# Patient Record
Sex: Male | Born: 1980
Health system: Southern US, Community
[De-identification: ages and names within clinical notes are randomized; demographics above are authoritative.]

## PROBLEM LIST (undated history)

## (undated) DIAGNOSIS — D179 Benign lipomatous neoplasm, unspecified: Secondary | ICD-10-CM

## (undated) DIAGNOSIS — R51 Headache: Secondary | ICD-10-CM

## (undated) DIAGNOSIS — E119 Type 2 diabetes mellitus without complications: Secondary | ICD-10-CM

## (undated) DIAGNOSIS — R519 Headache, unspecified: Secondary | ICD-10-CM

## (undated) HISTORY — DX: Headache, unspecified: R51.9

## (undated) HISTORY — DX: Type 2 diabetes mellitus without complications: E11.9

## (undated) HISTORY — DX: Headache: R51

## (undated) HISTORY — PX: OTHER SURGICAL HISTORY: SHX169

## (undated) HISTORY — DX: Benign lipomatous neoplasm, unspecified: D17.9

---

## 2017-08-31 DIAGNOSIS — J029 Acute pharyngitis, unspecified: Secondary | ICD-10-CM | POA: Diagnosis not present

## 2017-09-25 DIAGNOSIS — Z23 Encounter for immunization: Secondary | ICD-10-CM | POA: Diagnosis not present

## 2018-08-09 ENCOUNTER — Ambulatory Visit: Payer: Self-pay | Admitting: Internal Medicine

## 2018-08-12 ENCOUNTER — Ambulatory Visit: Payer: Self-pay | Admitting: Internal Medicine

## 2018-09-23 ENCOUNTER — Encounter

## 2018-09-23 ENCOUNTER — Encounter: Payer: Self-pay | Admitting: Internal Medicine

## 2018-09-23 ENCOUNTER — Ambulatory Visit: Payer: Commercial Managed Care - PPO | Admitting: Internal Medicine

## 2018-09-23 VITALS — BP 128/80 | HR 81 | Temp 98.5°F | Ht 65.0 in | Wt 185.4 lb

## 2018-09-23 DIAGNOSIS — K760 Fatty (change of) liver, not elsewhere classified: Secondary | ICD-10-CM

## 2018-09-23 DIAGNOSIS — B356 Tinea cruris: Secondary | ICD-10-CM

## 2018-09-23 DIAGNOSIS — L989 Disorder of the skin and subcutaneous tissue, unspecified: Secondary | ICD-10-CM

## 2018-09-23 DIAGNOSIS — D179 Benign lipomatous neoplasm, unspecified: Secondary | ICD-10-CM

## 2018-09-23 DIAGNOSIS — R748 Abnormal levels of other serum enzymes: Secondary | ICD-10-CM

## 2018-09-23 DIAGNOSIS — L309 Dermatitis, unspecified: Secondary | ICD-10-CM | POA: Diagnosis not present

## 2018-09-23 DIAGNOSIS — Z23 Encounter for immunization: Secondary | ICD-10-CM | POA: Diagnosis not present

## 2018-09-23 DIAGNOSIS — E119 Type 2 diabetes mellitus without complications: Secondary | ICD-10-CM

## 2018-09-23 DIAGNOSIS — Z1329 Encounter for screening for other suspected endocrine disorder: Secondary | ICD-10-CM

## 2018-09-23 DIAGNOSIS — Z113 Encounter for screening for infections with a predominantly sexual mode of transmission: Secondary | ICD-10-CM

## 2018-09-23 DIAGNOSIS — M79622 Pain in left upper arm: Secondary | ICD-10-CM

## 2018-09-23 DIAGNOSIS — E559 Vitamin D deficiency, unspecified: Secondary | ICD-10-CM

## 2018-09-23 MED ORDER — MICONAZOLE NITRATE 2 % EX CREA: 1.0000 "application " | TOPICAL_CREAM | Freq: Two times a day (BID) | CUTANEOUS | 0 refills | Status: DC

## 2018-09-23 MED ORDER — FLUCONAZOLE 150 MG PO TABS: 150.0000 mg | ORAL_TABLET | Freq: Once | ORAL | 0 refills | Status: DC

## 2018-09-23 MED ORDER — CLOBETASOL PROPIONATE 0.05 % EX CREA: 1.0000 "application " | TOPICAL_CREAM | Freq: Two times a day (BID) | CUTANEOUS | 0 refills | Status: DC

## 2018-09-23 NOTE — Progress Notes (Signed)
Pre visit review using our clinic review tool, if applicable. No additional management support is needed unless otherwise documented below in the visit note. 

## 2018-09-23 NOTE — Progress Notes (Signed)
Chief Complaint  Patient presents with  . Establish Care   New pt  1. C/o rash to groin area since summer 2019 after sweating  2. DM 2 A1C was 6.9 12/03/17 at [facility]  3. C/o deeper lesions trunk, b/l arms and back, left upper arm lesion causes pain with heavy lifting 4. C/o cyst to right thigh then another lesion developed right thigh red and raised nothing tried   Review of Systems  Constitutional: Negative for weight loss.  HENT: Negative for hearing loss.   Eyes: Negative for blurred vision.  Respiratory: Negative for shortness of breath.   Cardiovascular: Negative for chest pain.  Gastrointestinal: Negative for abdominal pain.  Musculoskeletal: Negative for falls.  Skin: Positive for rash.  Neurological: Negative for headaches.  Psychiatric/Behavioral: Negative for depression.   Past Medical History:  Diagnosis Date  . Diabetes mellitus without complication ([place])   . Frequent headaches    Past Surgical History:  Procedure Laterality Date  . right cyst removal     right forehead    Family History  Problem Relation Age of Onset  . Asthma Father   . Diabetes Father        2  . Hypertension Father    Social History   Socioeconomic History  . Marital status: Single    Spouse name: Not on file  . Number of children: Not on file  . Years of education: Not on file  . Highest education level: Not on file  Occupational History  . Not on file  Social Needs  . Financial resource strain: Not on file  . Food insecurity:    Worry: Not on file    Inability: Not on file  . Transportation needs:    Medical: Not on file    Non-medical: Not on file  Tobacco Use  . Smoking status: Never Smoker  . Smokeless tobacco: Never Used  Substance and Sexual Activity  . Alcohol use: Yes  . Drug use: Not Currently  . Sexual activity: Yes    Partners: Female  Lifestyle  . Physical activity:    Days per week: Not on file    Minutes per session: Not on file  . Stress: Not on file    Relationships  . Social connections:    Talks on phone: Not on file    Gets together: Not on file    Attends religious service: Not on file    Active member of club or organization: Not on file    Attends meetings of clubs or organizations: Not on file    Relationship status: Not on file  . Intimate partner violence:    Fear of current or ex partner: Not on file    Emotionally abused: Not on file    Physically abused: Not on file    Forced sexual activity: Not on file  Other Topics Concern  . Not on file  Social History Narrative   HS ed    [employer]    Single 2 kids    No outpatient medications have been marked as taking for the 09/23/18 encounter (Office Visit) with McLean-Scocuzza, Nino Glow, MD.   No Known Allergies No results found for this or any previous visit (from the past 2160 hour(s)). Objective  Body mass index is 30.85 kg/m. Wt Readings from Last 3 Encounters:  09/23/18 185 lb 6.4 oz (84.1 kg)   Temp Readings from Last 3 Encounters:  09/23/18 98.5 F (36.9 C) (Oral)   BP Readings from Last  3 Encounters:  09/23/18 128/80   Pulse Readings from Last 3 Encounters:  09/23/18 81    Physical Exam  Constitutional: He is oriented to person, place, and time. Vital signs are normal. He appears well-developed and well-nourished. He is cooperative.  HENT:  Head: Normocephalic and atraumatic.  Mouth/Throat: Oropharynx is clear and moist and mucous membranes are normal.  Eyes: Pupils are equal, round, and reactive to light. Conjunctivae are normal.  Cardiovascular: Normal rate, regular rhythm and normal heart sounds.  Pulmonary/Chest: Effort normal and breath sounds normal.  Neurological: He is alert and oriented to person, place, and time. Gait normal.  Skin: Skin is warm, dry and intact.     Psychiatric: He has a normal mood and affect. His speech is normal and behavior is normal. Judgment and thought content normal. Cognition and memory are normal.  Nursing note and  vitals reviewed.   Assessment   1. T cruris  2. DM 2 A1C 6.9 12/03/17  3. ? Lipoma vs NF left upper arm causing pain. Also on trunk and abdomen 4. Right thigh dermatosis  5. HM 6. Elevated lfts  Plan   1. M[place], diflucan x 1 zeasorb af  2. Check fasting labs asap f/u in 2 weeks  Consider nutrition eye and foot exam  3. MRI left humerus  4. Clobetasol  5.  Flu and Tdap today  Consider STD check in future  6. Repeat lfts, US abdomen    Provider: Dr. Olivia Mackie McLean-Scocuzza-Internal Medicine

## 2018-09-23 NOTE — Patient Instructions (Addendum)
Zeasorb AF powder to keep you dry in the summer  Mild steroid cream hydrocortisone over the counter 1% 2x per day to private area  -with antifungal cream Miconazole    Diabetes Mellitus and Exercise Exercising regularly is important for your overall health, especially when you have diabetes (diabetes mellitus). Exercising is not only about losing weight. It has many other health benefits, such as increasing muscle strength and bone density and reducing body fat and stress. This leads to improved fitness, flexibility, and endurance, all of which result in better overall health. Exercise has additional benefits for people with diabetes, including:  Reducing appetite.  Helping to lower and control blood glucose.  Lowering blood pressure.  Helping to control amounts of fatty substances (lipids) in the blood, such as cholesterol and triglycerides.  Helping the body to respond better to insulin (improving insulin sensitivity).  Reducing how much insulin the body needs.  Decreasing the risk for heart disease by: ? Lowering cholesterol and triglyceride levels. ? Increasing the levels of good cholesterol. ? Lowering blood glucose levels. What is my activity plan? Your health care provider or certified diabetes educator can help you make a plan for the type and frequency of exercise (activity plan) that works for you. Make sure that you:  Do at least 150 minutes of moderate-intensity or vigorous-intensity exercise each week. This could be brisk walking, biking, or water aerobics. ? Do stretching and strength exercises, such as yoga or weightlifting, at least 2 times a week. ? Spread out your activity over at least 3 days of the week.  Get some form of physical activity every day. ? Do not go more than 2 days in a row without some kind of physical activity. ? Avoid being inactive for more than 30 minutes at a time. Take frequent breaks to walk or stretch.  Choose a type of exercise or  activity that you enjoy, and set realistic goals.  Start slowly, and gradually increase the intensity of your exercise over time. What do I need to know about managing my diabetes?   Check your blood glucose before and after exercising. ? If your blood glucose is 240 mg/dL (13.3 mmol/L) or higher before you exercise, check your urine for ketones. If you have ketones in your urine, do not exercise until your blood glucose returns to normal. ? If your blood glucose is 100 mg/dL (5.6 mmol/L) or lower, eat a snack containing 15-20 grams of carbohydrate. Check your blood glucose 15 minutes after the snack to make sure that your level is above 100 mg/dL (5.6 mmol/L) before you start your exercise.  Know the symptoms of low blood glucose (hypoglycemia) and how to treat it. Your risk for hypoglycemia increases during and after exercise. Common symptoms of hypoglycemia can include: ? Hunger. ? Anxiety. ? Sweating and feeling clammy. ? Confusion. ? Dizziness or feeling light-headed. ? Increased heart rate or palpitations. ? Blurry vision. ? Tingling or numbness around the mouth, lips, or tongue. ? Tremors or shakes. ? Irritability.  Keep a rapid-acting carbohydrate snack available before, during, and after exercise to help prevent or treat hypoglycemia.  Avoid injecting insulin into areas of the body that are going to be exercised. For example, avoid injecting insulin into: ? The arms, when playing tennis. ? The legs, when jogging.  Keep records of your exercise habits. Doing this can help you and your health care provider adjust your diabetes management plan as needed. Write down: ? Food that you eat before and  after you exercise. ? Blood glucose levels before and after you exercise. ? The type and amount of exercise you have done. ? When your insulin is expected to peak, if you use insulin. Avoid exercising at times when your insulin is peaking.  When you start a new exercise or activity,  work with your health care provider to make sure the activity is safe for you, and to adjust your insulin, medicines, or food intake as needed.  Drink plenty of water while you exercise to prevent dehydration or heat stroke. Drink enough fluid to keep your urine clear or pale yellow. Summary  Exercising regularly is important for your overall health, especially when you have diabetes (diabetes mellitus).  Exercising has many health benefits, such as increasing muscle strength and bone density and reducing body fat and stress.  Your health care provider or certified diabetes educator can help you make a plan for the type and frequency of exercise (activity plan) that works for you.  When you start a new exercise or activity, work with your health care provider to make sure the activity is safe for you, and to adjust your insulin, medicines, or food intake as needed. This information is not intended to replace advice given to you by your health care provider. Make sure you discuss any questions you have with your health care provider. Document Released: 12/26/2003 Document Revised: 04/15/2017 Document Reviewed: 03/16/2016 Elsevier Interactive Patient Education  2019 [place].  Diabetes Mellitus and Nutrition, Adult When you have diabetes (diabetes mellitus), it is very important to have healthy eating habits because your blood sugar (glucose) levels are greatly affected by what you eat and drink. Eating healthy foods in the appropriate amounts, at about the same times every day, can help you:  Control your blood glucose.  Lower your risk of heart disease.  Improve your blood pressure.  Reach or maintain a healthy weight. Every person with diabetes is different, and each person has different needs for a meal plan. Your health care provider may recommend that you work with a diet and nutrition specialist (dietitian) to make a meal plan that is best for you. Your meal plan may vary  depending on factors such as:  The calories you need.  The medicines you take.  Your weight.  Your blood glucose, blood pressure, and cholesterol levels.  Your activity level.  Other health conditions you have, such as heart or kidney disease. How do carbohydrates affect me? Carbohydrates, also called carbs, affect your blood glucose level more than any other type of food. Eating carbs naturally raises the amount of glucose in your blood. Carb counting is a method for keeping track of how many carbs you eat. Counting carbs is important to keep your blood glucose at a healthy level, especially if you use insulin or take certain oral diabetes medicines. It is important to know how many carbs you can safely have in each meal. This is different for every person. Your dietitian can help you calculate how many carbs you should have at each meal and for each snack. Foods that contain carbs include:  Bread, cereal, rice, pasta, and crackers.  Potatoes and corn.  Peas, beans, and lentils.  Milk and yogurt.  Fruit and juice.  Desserts, such as cakes, cookies, ice cream, and candy. How does alcohol affect me? Alcohol can cause a sudden decrease in blood glucose (hypoglycemia), especially if you use insulin or take certain oral diabetes medicines. Hypoglycemia can be a life-threatening condition. Symptoms  of hypoglycemia (sleepiness, dizziness, and confusion) are similar to symptoms of having too much alcohol. If your health care provider says that alcohol is safe for you, follow these guidelines:  Limit alcohol intake to no more than 1 drink per day for nonpregnant women and 2 drinks per day for men. One drink equals 12 oz of beer, 5 oz of wine, or 1 oz of hard liquor.  Do not drink on an empty stomach.  Keep yourself hydrated with water, diet soda, or unsweetened iced tea.  Keep in mind that regular soda, juice, and other mixers may contain a lot of sugar and must be counted as  carbs. What are tips for following this plan?  Reading food labels  Start by checking the serving size on the "Nutrition Facts" label of packaged foods and drinks. The amount of calories, carbs, fats, and other nutrients listed on the label is based on one serving of the item. Many items contain more than one serving per package.  Check the total grams (g) of carbs in one serving. You can calculate the number of servings of carbs in one serving by dividing the total carbs by 15. For example, if a food has 30 g of total carbs, it would be equal to 2 servings of carbs.  Check the number of grams (g) of saturated and trans fats in one serving. Choose foods that have low or no amount of these fats.  Check the number of milligrams (mg) of salt (sodium) in one serving. Most people should limit total sodium intake to less than 2,300 mg per day.  Always check the nutrition information of foods labeled as "low-fat" or "nonfat". These foods may be higher in added sugar or refined carbs and should be avoided.  Talk to your dietitian to identify your daily goals for nutrients listed on the label. Shopping  Avoid buying canned, premade, or processed foods. These foods tend to be high in fat, sodium, and added sugar.  Shop around the outside edge of the grocery store. This includes fresh fruits and vegetables, bulk grains, fresh meats, and fresh dairy. Cooking  Use low-heat cooking methods, such as baking, instead of high-heat cooking methods like deep frying.  Cook using healthy oils, such as olive, canola, or sunflower oil.  Avoid cooking with butter, cream, or high-fat meats. Meal planning  Eat meals and snacks regularly, preferably at the same times every day. Avoid going long periods of time without eating.  Eat foods high in fiber, such as fresh fruits, vegetables, beans, and whole grains. Talk to your dietitian about how many servings of carbs you can eat at each meal.  Eat 4-6 ounces (oz)  of lean protein each day, such as lean meat, chicken, fish, eggs, or tofu. One oz of lean protein is equal to: ? 1 oz of meat, chicken, or fish. ? 1 egg. ?  cup of tofu.  Eat some foods each day that contain healthy fats, such as avocado, nuts, seeds, and fish. Lifestyle  Check your blood glucose regularly.  Exercise regularly as told by your health care provider. This may include: ? 150 minutes of moderate-intensity or vigorous-intensity exercise each week. This could be brisk walking, biking, or water aerobics. ? Stretching and doing strength exercises, such as yoga or weightlifting, at least 2 times a week.  Take medicines as told by your health care provider.  Do not use any products that contain nicotine or tobacco, such as cigarettes and e-cigarettes. If you need  help quitting, ask your health care provider.  Work with a Social worker or diabetes educator to identify strategies to manage stress and any emotional and social challenges. Questions to ask a health care provider  Do I need to meet with a diabetes educator?  Do I need to meet with a dietitian?  What number can I call if I have questions?  When are the best times to check my blood glucose? Where to find more information:  American Diabetes Association: diabetes.org  Academy of Nutrition and Dietetics: www.eatright.[employer] of Diabetes and Digestive and Kidney Diseases (NIH): [employer] Summary  A healthy meal plan will help you control your blood glucose and maintain a healthy lifestyle.  Working with a diet and nutrition specialist (dietitian) can help you make a meal plan that is best for you.  Keep in mind that carbohydrates (carbs) and alcohol have immediate effects on your blood glucose levels. It is important to count carbs and to use alcohol carefully. This information is not intended to replace advice given to you by your health care provider. Make sure you discuss any questions you  have with your health care provider. Document Released: 07/02/2005 Document Revised: 05/05/2017 Document Reviewed: 11/09/2016 Elsevier Interactive Patient Education  2019 [employer] Inc[employer]   Nonalcoholic Fatty Liver Disease Diet Nonalcoholic fatty liver disease is a condition that causes fat to accumulate in and around the liver. The disease makes it harder for the liver to work the way that it should. Following a healthy diet can help to keep nonalcoholic fatty liver disease under control. It can also help to prevent or improve conditions that are associated with the disease, such as heart disease, diabetes, high blood pressure, and abnormal cholesterol levels. Along with regular exercise, this diet:  Promotes weight loss.  Helps to control blood sugar levels.  Helps to improve the way that the body uses insulin. What do I need to know about this diet?  Use the glycemic index (GI) to plan your meals. The index tells you how quickly a food will raise your blood sugar. Choose low-GI foods. These foods take a longer time to raise blood sugar.  Keep track of how many calories you take in. Eating the right amount of calories will help you to achieve a healthy weight.  You may want to follow a Mediterranean diet. This diet includes a lot of vegetables, lean meats or fish, whole grains, fruits, and healthy oils and fats. What foods can I eat? Grains Whole grains, such as whole-wheat or whole-grain breads, crackers, tortillas, cereals, and pasta. Stone-ground whole wheat. Pumpernickel bread. Unsweetened oatmeal. Bulgur. Barley. Quinoa. Brown or wild rice. Corn or whole-wheat flour tortillas. Vegetables Lettuce. Spinach. Peas. Beets. Cauliflower. Cabbage. Broccoli. Carrots. Tomatoes. Squash. Eggplant. Herbs. Peppers. Onions. Cucumbers. Brussels sprouts. Yams and sweet potatoes. Beans. Lentils. Fruits Bananas. Apples. Oranges. Grapes. Papaya. Mango. Pomegranate. Kiwi. Grapefruit. Cherries. Meats and  Other Protein Sources Seafood and shellfish. Lean meats. Poultry. Tofu. Dairy Low-fat or fat-free dairy products, such as yogurt, cottage cheese, and cheese. Beverages Water. Sugar-free drinks. Tea. Coffee. Low-fat or skim milk. Milk alternatives, such as soy or almond milk. Real fruit juice. Condiments Mustard. Relish. Low-fat, low-sugar ketchup and barbecue sauce. Low-fat or fat-free mayonnaise. Sweets and Desserts Sugar-free sweets. Fats and Oils Avocado. Canola or olive oil. Nuts and nut butters. Seeds. The items listed above may not be a complete list of recommended foods or beverages. Contact your dietitian for more options. What foods are not recommended? [employer]  oil and coconut oil. Processed foods. Fried foods. Sweetened drinks, such as sweet tea, milkshakes, snow cones, iced sweet drinks, and sodas. Alcohol. Sweets. Foods that contain a lot of salt or sodium. The items listed above may not be a complete list of foods and beverages to avoid. Contact your dietitian for more information. This information is not intended to replace advice given to you by your health care provider. Make sure you discuss any questions you have with your health care provider. Document Released: 02/19/2015 Document Revised: 03/12/2016 Document Reviewed: 10/30/2014 Elsevier Interactive Patient Education  2019 [employer] Inc.  Fatty Liver Disease  Fatty liver disease occurs when too much fat has built up in your liver cells. Fatty liver disease is also called hepatic steatosis or steatohepatitis. The liver removes harmful substances from your bloodstream and produces fluids that your body needs. It also helps your body use and store energy from the food you eat. In many cases, fatty liver disease does not cause symptoms or problems. It is often diagnosed when tests are being done for other reasons. However, over time, fatty liver can cause inflammation that may lead to more serious liver problems, such as scarring  of the liver (cirrhosis) and liver failure. Fatty liver is associated with insulin resistance, increased body fat, high blood pressure (hypertension), and high cholesterol. These are features of metabolic syndrome and increase your risk for stroke, diabetes, and heart disease. What are the causes? This condition may be caused by:  Drinking too much alcohol.  Poor nutrition.  Obesity.  Cushing's syndrome.  Diabetes.  High cholesterol.  Certain drugs.  Poisons.  Some viral infections.  Pregnancy. What increases the risk? You are more likely to develop this condition if you:  Abuse alcohol.  Are overweight.  Have diabetes.  Have hepatitis.  Have a high triglyceride level.  Are pregnant. What are the signs or symptoms? Fatty liver disease often does not cause symptoms. If symptoms do develop, they can include:  Fatigue.  Weakness.  Weight loss.  Confusion.  Abdominal pain.  Nausea and vomiting.  Yellowing of your skin and the white parts of your eyes (jaundice).  Itchy skin. How is this diagnosed? This condition may be diagnosed by:  A physical exam and medical history.  Blood tests.  Imaging tests, such as an ultrasound, CT scan, or MRI.  A liver biopsy. A small sample of liver tissue is removed using a needle. The sample is then looked at under a microscope. How is this treated? Fatty liver disease is often caused by other health conditions. Treatment for fatty liver may involve medicines and lifestyle changes to manage conditions such as:  Alcoholism.  High cholesterol.  Diabetes.  Being overweight or obese. Follow these instructions at home:   Do not drink alcohol. If you have trouble quitting, ask your health care provider how to safely quit with the help of medicine or a supervised program. This is important to keep your condition from getting worse.  Eat a healthy diet as told by your health care provider. Ask your health care  provider about working with a diet and nutrition specialist (dietitian) to develop an eating plan.  Exercise regularly. This can help you lose weight and control your cholesterol and diabetes. Talk to your health care provider about an exercise plan and which activities are best for you.  Take over-the-counter and prescription medicines only as told by your health care provider.  Keep all follow-up visits as told by your health care  provider. This is important. Contact a health care provider if: You have trouble controlling your:  Blood sugar. This is especially important if you have diabetes.  Cholesterol.  Drinking of alcohol. Get help right away if:  You have abdominal pain.  You have jaundice.  You have nausea and vomiting.  You vomit blood or material that looks like coffee grounds.  You have stools that are black, tar-like, or bloody. Summary  Fatty liver disease develops when too much fat builds up in the cells of your liver.  Fatty liver disease often causes no symptoms or problems. However, over time, fatty liver can cause inflammation that may lead to more serious liver problems, such as scarring of the liver (cirrhosis).  You are more likely to develop this condition if you abuse alcohol, are pregnant, are overweight, have diabetes, have hepatitis, or have high triglyceride levels.  Contact your health care provider if you have trouble controlling your weight, blood sugar, cholesterol, or drinking of alcohol. This information is not intended to replace advice given to you by your health care provider. Make sure you discuss any questions you have with your health care provider. Document Released: 11/20/2005 Document Revised: 07/14/2017 Document Reviewed: 07/14/2017 Elsevier Interactive Patient Education  2019 [employer]   Lipoma A lipoma is a noncancerous (benign) tumor that is made up of fat cells. This is a very common type of soft-tissue growth. Lipomas are  usually found under the skin (subcutaneous). They may occur in any tissue of the body that contains fat. Common areas for lipomas to appear include the back, shoulders, buttocks, and thighs. Lipomas grow slowly, and they are usually painless. Most lipomas do not cause problems and do not require treatment. What are the causes? The cause of this condition is not known. What increases the risk? This condition is more likely to develop in:  People who are 90-49 years old.  People who have a family history of lipomas.  What are the signs or symptoms? A lipoma usually appears as a small, round bump under the skin. It may feel soft or rubbery, but the firmness can vary. Most lipomas are not painful. However, a lipoma may become painful if it is located in an area where it pushes on nerves. How is this diagnosed? A lipoma can usually be diagnosed with a physical exam. You may also have tests to confirm the diagnosis and to rule out other conditions. Tests may include:  Imaging tests, such as a CT scan or MRI.  Removal of a tissue sample to be looked at under a microscope (biopsy).  How is this treated? Treatment is not needed for small lipomas that are not causing problems. If a lipoma continues to get bigger or it causes problems, removal is often the best option. Lipomas can also be removed to improve appearance. Removal of a lipoma is usually done with a surgery in which the fatty cells and the surrounding capsule are removed. Most often, a medicine that numbs the area (local anesthetic) is used for this procedure. Follow these instructions at home:  Keep all follow-up visits as directed by your health care provider. This is important. Contact a health care provider if:  Your lipoma becomes larger or hard.  Your lipoma becomes painful, red, or increasingly swollen. These could be signs of infection or a more serious condition. This information is not intended to replace advice given to you  by your health care provider. Make sure you discuss any questions you have  with your health care provider. Document Released: 09/25/2002 Document Revised: 03/12/2016 Document Reviewed: 10/01/2014 Elsevier Interactive Patient Education  2018 [place] Jock itch (tinea cruris) is a fungal infection of the skin in the groin area. It is sometimes called ringworm, even though it is not caused by worms. It is caused by a fungus, which is a type of germ that thrives in dark, damp places. Jock itch causes a rash and itching in the groin and upper thigh area. It usually goes away in 2-3 weeks with treatment. What are the causes? The fungus that causes jock itch may be spread by:  Touching a fungus infection elsewhere on your body-such as athlete's foot-and then touching your groin area.  Sharing towels or clothing with an infected person.  What increases the risk? Jock itch is most common in men and adolescent boys. This condition is more likely to develop from:  Being in hot, humid climates.  Wearing tight-fitting clothing or wet bathing suits for long periods of time.  Participating in sports.  Being overweight.  Having diabetes.  What are the signs or symptoms? Symptoms of jock itch may include:  A red, pink, or brown rash in the groin area. The rash may spread to the thighs, anus, and buttocks.  Dry and scaly skin on or around the rash.  Itchiness.  How is this diagnosed? Most often, a health care provider can make the diagnosis by looking at your rash. Sometimes, a scraping of the infected skin will be taken. This sample may be tested by looking at it under a microscope or by trying to grow the fungus from the sample (culture). How is this treated? Treatment for this condition may include:  Antifungal medicine to kill the fungus. This may be in various forms: ? Skin cream or ointment. ? Medicine taken by mouth.  Skin cream or ointment to reduce the  itching.  Compresses or medicated powders to dry the infected skin.  Follow these instructions at home:  Take medicines only as directed by your health care provider. Apply skin creams or ointments exactly as directed.  Wear loose-fitting clothing. ? Men should wear cotton boxer shorts. ? Women should wear cotton underwear.  Change your underwear every day to keep your groin dry.  Avoid hot baths.  Dry your groin area well after bathing. ? Use a separate towel to dry your groin area. This will help to prevent a spreading of the infection to other areas of your body.  Do not scratch the affected area.  Do not share towels with other people. Contact a health care provider if:  Your rash does not improve or it gets worse after 2 weeks of treatment.  Your rash is spreading.  Your rash returns after treatment is finished.  You have a fever.  You have redness, swelling, or pain in the area around your rash.  You have fluid, blood, or pus coming from your rash.  Your have your rash for more than 4 weeks. This information is not intended to replace advice given to you by your health care provider. Make sure you discuss any questions you have with your health care provider. Document Released: 09/25/2002 Document Revised: 03/12/2016 Document Reviewed: 07/17/2014 Elsevier Interactive Patient Education  2018 [place] Vaccine (Diphtheria, Tetanus, and Pertussis): What You Need to Know 1. Why get vaccinated? Diphtheria, tetanus, and pertussis are serious diseases caused by bacteria. Diphtheria and pertussis are spread from person to person.  Tetanus enters the body through cuts or wounds. DIPHTHERIA causes a thick covering in the back of the throat.  It can lead to breathing problems, paralysis, heart failure, and even death.  TETANUS (Lockjaw) causes painful tightening of the muscles, usually all over the body.  It can lead to "locking" of the jaw so the  victim cannot open his mouth or swallow. Tetanus leads to death in up to 2 out of 10 cases.  PERTUSSIS (Whooping Cough) causes coughing spells so bad that it is hard for infants to eat, drink, or breathe. These spells can last for weeks.  It can lead to pneumonia, seizures (jerking and staring spells), brain damage, and death.  Diphtheria, tetanus, and pertussis vaccine (DTaP) can help prevent these diseases. Most children who are vaccinated with DTaP will be protected throughout childhood. Many more children would get these diseases if we stopped vaccinating. DTaP is a safer version of an older vaccine called DTP. DTP is no longer used in the [place]. 2. Who should get DTaP vaccine and when? Children should get 5 doses of DTaP vaccine, one dose at each of the following ages:  2 months  4 months  6 months  15-18 months  4-6 years  DTaP may be given at the same time as other vaccines. 3. Some children should not get DTaP vaccine or should wait  Children with minor illnesses, such as a cold, may be vaccinated. But children who are moderately or severely ill should usually wait until they recover before getting DTaP vaccine.  Any child who had a life-threatening allergic reaction after a dose of DTaP should not get another dose.  Any child who suffered a brain or nervous system disease within 7 days after a dose of DTaP should not get another dose.  Talk with your doctor if your child: ? had a seizure or collapsed after a dose of DTaP, ? cried non-stop for 3 hours or more after a dose of DTaP, ? had a fever over 105F after a dose of DTaP. Ask your doctor for more information. Some of these children should not get another dose of pertussis vaccine, but may get a vaccine without pertussis, called DT. 4. Older children and adults DTaP is not licensed for adolescents, adults, or children 19 years of age and older. But older people still need protection. A vaccine called Tdap is  similar to DTaP. A single dose of Tdap is recommended for people 11 through 35 years of age. Another vaccine, called Td, protects against tetanus and diphtheria, but not pertussis. It is recommended every 10 years. There are separate Vaccine Information Statements for these vaccines. 5. What are the risks from DTaP vaccine? Getting diphtheria, tetanus, or pertussis disease is much riskier than getting DTaP vaccine. However, a vaccine, like any medicine, is capable of causing serious problems, such as severe allergic reactions. The risk of DTaP vaccine causing serious harm, or death, is extremely small. Mild problems (common)  Fever (up to about 1 child in 4)  Redness or swelling where the shot was given (up to about 1 child in 4)  Soreness or tenderness where the shot was given (up to about 1 child in 4) These problems occur more often after the 4th and 5th doses of the DTaP series than after earlier doses. Sometimes the 4th or 5th dose of DTaP vaccine is followed by swelling of the entire arm or leg in which the shot was given, lasting 1-7 days (up to about  1 child in 56). Other mild problems include:  Fussiness (up to about 1 child in 3)  Tiredness or poor appetite (up to about 1 child in 10)  Vomiting (up to about 1 child in 51) These problems generally occur 1-3 days after the shot. Moderate problems (uncommon)  Seizure (jerking or staring) (about 1 child out of 14,000)  Non-stop crying, for 3 hours or more (up to about 1 child out of 1,000)  High fever, over 105F (about 1 child out of 16,000) Severe problems (very rare)  Serious allergic reaction (less than 1 out of a million doses)  Several other severe problems have been reported after DTaP vaccine. These include: ? Long-term seizures, coma, or lowered consciousness ? Permanent brain damage. These are so rare it is hard to tell if they are caused by the vaccine. Controlling fever is especially important for children who  have had seizures, for any reason. It is also important if another family member has had seizures. You can reduce fever and pain by giving your child an aspirin-free pain reliever when the shot is given, and for the next 24 hours, following the package instructions. 6. What if there is a serious reaction? What should I look for? Look for anything that concerns you, such as signs of a severe allergic reaction, very high fever, or behavior changes. Signs of a severe allergic reaction can include hives, swelling of the face and throat, difficulty breathing, a fast heartbeat, dizziness, and weakness. These would start a few minutes to a few hours after the vaccination. What should I do?  If you think it is a severe allergic reaction or other emergency that can't wait, call 9-1-1 or get the person to the nearest hospital. Otherwise, call your doctor.  Afterward, the reaction should be reported to the Vaccine Adverse Event Reporting System (VAERS). Your doctor might file this report, or you can do it yourself through the VAERS web site at www.vaers.SamedayNews.es, or by calling 310-705-5294. ? VAERS is only for reporting reactions. They do not give medical advice. 7. The National Vaccine Injury Compensation Program The [employer] Vaccine Injury Compensation Program (VICP) is a federal program that was created to compensate people who may have been injured by certain vaccines. Persons who believe they may have been injured by a vaccine can learn about the program and about filing a claim by calling (704)230-9237 or visiting the [place] website at GoldCloset.com.ee. 8. How can I learn more?  Ask your doctor.  Call your local or state health department.  Contact the Centers for Disease Control and Prevention (CDC): ? Call 9732147911 (1-800-CDC-INFO) or ? Visit CDC's website at http://hunter.com/ CDC DTaP Vaccine (Diphtheria, Tetanus, and Pertussis) VIS (03/04/06) This information is not  intended to replace advice given to you by your health care provider. Make sure you discuss any questions you have with your health care provider. Document Released: 08/02/2006 Document Revised: 06/25/2016 Document Reviewed: 06/25/2016 Elsevier Interactive Patient Education  2017 [place].  Diabetes Mellitus and Nutrition When you have diabetes (diabetes mellitus), it is very important to have healthy eating habits because your blood sugar (glucose) levels are greatly affected by what you eat and drink. Eating healthy foods in the appropriate amounts, at about the same times every day, can help you:  Control your blood glucose.  Lower your risk of heart disease.  Improve your blood pressure.  Reach or maintain a healthy weight.  Every person with diabetes is different, and each person has different needs  for a meal plan. Your health care provider may recommend that you work with a diet and nutrition specialist (dietitian) to make a meal plan that is best for you. Your meal plan may vary depending on factors such as:  The calories you need.  The medicines you take.  Your weight.  Your blood glucose, blood pressure, and cholesterol levels.  Your activity level.  Other health conditions you have, such as heart or kidney disease.  How do carbohydrates affect me? Carbohydrates affect your blood glucose level more than any other type of food. Eating carbohydrates naturally increases the amount of glucose in your blood. Carbohydrate counting is a method for keeping track of how many carbohydrates you eat. Counting carbohydrates is important to keep your blood glucose at a healthy level, especially if you use insulin or take certain oral diabetes medicines. It is important to know how many carbohydrates you can safely have in each meal. This is different for every person. Your dietitian can help you calculate how many carbohydrates you should have at each meal and for snack. Foods that  contain carbohydrates include:  Bread, cereal, rice, pasta, and crackers.  Potatoes and corn.  Peas, beans, and lentils.  Milk and yogurt.  Fruit and juice.  Desserts, such as cakes, cookies, ice cream, and candy.  How does alcohol affect me? Alcohol can cause a sudden decrease in blood glucose (hypoglycemia), especially if you use insulin or take certain oral diabetes medicines. Hypoglycemia can be a life-threatening condition. Symptoms of hypoglycemia (sleepiness, dizziness, and confusion) are similar to symptoms of having too much alcohol. If your health care provider says that alcohol is safe for you, follow these guidelines:  Limit alcohol intake to no more than 1 drink per day for nonpregnant women and 2 drinks per day for men. One drink equals 12 oz of beer, 5 oz of wine, or 1 oz of hard liquor.  Do not drink on an empty stomach.  Keep yourself hydrated with water, diet soda, or unsweetened iced tea.  Keep in mind that regular soda, juice, and other mixers may contain a lot of sugar and must be counted as carbohydrates.  What are tips for following this plan? Reading food labels  Start by checking the serving size on the label. The amount of calories, carbohydrates, fats, and other nutrients listed on the label are based on one serving of the food. Many foods contain more than one serving per package.  Check the total grams (g) of carbohydrates in one serving. You can calculate the number of servings of carbohydrates in one serving by dividing the total carbohydrates by 15. For example, if a food has 30 g of total carbohydrates, it would be equal to 2 servings of carbohydrates.  Check the number of grams (g) of saturated and trans fats in one serving. Choose foods that have low or no amount of these fats.  Check the number of milligrams (mg) of sodium in one serving. Most people should limit total sodium intake to less than 2,300 mg per day.  Always check the nutrition  information of foods labeled as "low-fat" or "nonfat". These foods may be higher in added sugar or refined carbohydrates and should be avoided.  Talk to your dietitian to identify your daily goals for nutrients listed on the label. Shopping  Avoid buying canned, premade, or processed foods. These foods tend to be high in fat, sodium, and added sugar.  Shop around the outside edge of the grocery store.  This includes fresh fruits and vegetables, bulk grains, fresh meats, and fresh dairy. Cooking  Use low-heat cooking methods, such as baking, instead of high-heat cooking methods like deep frying.  Cook using healthy oils, such as olive, canola, or sunflower oil.  Avoid cooking with butter, cream, or high-fat meats. Meal planning  Eat meals and snacks regularly, preferably at the same times every day. Avoid going long periods of time without eating.  Eat foods high in fiber, such as fresh fruits, vegetables, beans, and whole grains. Talk to your dietitian about how many servings of carbohydrates you can eat at each meal.  Eat 4-6 ounces of lean protein each day, such as lean meat, chicken, fish, eggs, or tofu. 1 ounce is equal to 1 ounce of meat, chicken, or fish, 1 egg, or 1/4 cup of tofu.  Eat some foods each day that contain healthy fats, such as avocado, nuts, seeds, and fish. Lifestyle   Check your blood glucose regularly.  Exercise at least 30 minutes 5 or more days each week, or as told by your health care provider.  Take medicines as told by your health care provider.  Do not use any products that contain nicotine or tobacco, such as cigarettes and e-cigarettes. If you need help quitting, ask your health care provider.  Work with a Social worker or diabetes educator to identify strategies to manage stress and any emotional and social challenges. What are some questions to ask my health care provider?  Do I need to meet with a diabetes educator?  Do I need to meet with a  dietitian?  What number can I call if I have questions?  When are the best times to check my blood glucose? Where to find more information:  American Diabetes Association: diabetes.org/food-and-fitness/food  Academy of Nutrition and Dietetics: PokerClues.dk  [employer] of Diabetes and Digestive and Kidney Diseases (NIH): ContactWire.be Summary  A healthy meal plan will help you control your blood glucose and maintain a healthy lifestyle.  Working with a diet and nutrition specialist (dietitian) can help you make a meal plan that is best for you.  Keep in mind that carbohydrates and alcohol have immediate effects on your blood glucose levels. It is important to count carbohydrates and to use alcohol carefully. This information is not intended to replace advice given to you by your health care provider. Make sure you discuss any questions you have with your health care provider. Document Released: 07/02/2005 Document Revised: 11/09/2016 Document Reviewed: 11/09/2016 Elsevier Interactive Patient Education  2018 [employer].  Diabetes Mellitus and Exercise Exercising regularly is important for your overall health, especially when you have diabetes (diabetes mellitus). Exercising is not only about losing weight. It has many health benefits, such as increasing muscle strength and bone density and reducing body fat and stress. This leads to improved fitness, flexibility, and endurance, all of which result in better overall health. Exercise has additional benefits for people with diabetes, including:  Reducing appetite.  Helping to lower and control blood glucose.  Lowering blood pressure.  Helping to control amounts of fatty substances (lipids) in the blood, such as cholesterol and triglycerides.  Helping the body to respond better to insulin (improving  insulin sensitivity).  Reducing how much insulin the body needs.  Decreasing the risk for heart disease by: ? Lowering cholesterol and triglyceride levels. ? Increasing the levels of good cholesterol. ? Lowering blood glucose levels.  What is my activity plan? Your health care provider or certified diabetes educator can  help you make a plan for the type and frequency of exercise (activity plan) that works for you. Make sure that you:  Do at least 150 minutes of moderate-intensity or vigorous-intensity exercise each week. This could be brisk walking, biking, or water aerobics. ? Do stretching and strength exercises, such as yoga or weightlifting, at least 2 times a week. ? Spread out your activity over at least 3 days of the week.  Get some form of physical activity every day. ? Do not go more than 2 days in a row without some kind of physical activity. ? Avoid being inactive for more than 90 minutes at a time. Take frequent breaks to walk or stretch.  Choose a type of exercise or activity that you enjoy, and set realistic goals.  Start slowly, and gradually increase the intensity of your exercise over time.  What do I need to know about managing my diabetes?  Check your blood glucose before and after exercising. ? If your blood glucose is higher than 240 mg/dL (13.3 mmol/L) before you exercise, check your urine for ketones. If you have ketones in your urine, do not exercise until your blood glucose returns to normal.  Know the symptoms of low blood glucose (hypoglycemia) and how to treat it. Your risk for hypoglycemia increases during and after exercise. Common symptoms of hypoglycemia can include: ? Hunger. ? Anxiety. ? Sweating and feeling clammy. ? Confusion. ? Dizziness or feeling light-headed. ? Increased heart rate or palpitations. ? Blurry vision. ? Tingling or numbness around the mouth, lips, or tongue. ? Tremors or shakes. ? Irritability.  Keep a rapid-acting  carbohydrate snack available before, during, and after exercise to help prevent or treat hypoglycemia.  Avoid injecting insulin into areas of the body that are going to be exercised. For example, avoid injecting insulin into: ? The arms, when playing tennis. ? The legs, when jogging.  Keep records of your exercise habits. Doing this can help you and your health care provider adjust your diabetes management plan as needed. Write down: ? Food that you eat before and after you exercise. ? Blood glucose levels before and after you exercise. ? The type and amount of exercise you have done. ? When your insulin is expected to peak, if you use insulin. Avoid exercising at times when your insulin is peaking.  When you start a new exercise or activity, work with your health care provider to make sure the activity is safe for you, and to adjust your insulin, medicines, or food intake as needed.  Drink plenty of water while you exercise to prevent dehydration or heat stroke. Drink enough fluid to keep your urine clear or pale yellow. This information is not intended to replace advice given to you by your health care provider. Make sure you discuss any questions you have with your health care provider. Document Released: 12/26/2003 Document Revised: 04/24/2016 Document Reviewed: 03/16/2016 Elsevier Interactive Patient Education  2018 [employer].

## 2018-10-07 ENCOUNTER — Other Ambulatory Visit: Payer: Commercial Managed Care - PPO

## 2018-10-07 ENCOUNTER — Ambulatory Visit: Payer: Commercial Managed Care - PPO

## 2018-10-14 ENCOUNTER — Ambulatory Visit: Payer: Commercial Managed Care - PPO | Admitting: Internal Medicine

## 2018-10-21 ENCOUNTER — Ambulatory Visit: Admission: RE | Admit: 2018-10-21 | Payer: Commercial Managed Care - PPO | Source: Ambulatory Visit

## 2018-12-09 ENCOUNTER — Ambulatory Visit: Payer: Commercial Managed Care - PPO

## 2018-12-09 ENCOUNTER — Ambulatory Visit: Payer: Commercial Managed Care - PPO | Admitting: Internal Medicine

## 2018-12-09 ENCOUNTER — Other Ambulatory Visit: Payer: Commercial Managed Care - PPO

## 2018-12-16 ENCOUNTER — Other Ambulatory Visit: Payer: Commercial Managed Care - PPO

## 2018-12-16 ENCOUNTER — Ambulatory Visit: Payer: Commercial Managed Care - PPO | Admitting: Internal Medicine

## 2019-01-06 ENCOUNTER — Other Ambulatory Visit: Payer: Commercial Managed Care - PPO

## 2019-01-06 ENCOUNTER — Ambulatory Visit: Payer: Commercial Managed Care - PPO

## 2019-01-13 ENCOUNTER — Ambulatory Visit: Payer: Commercial Managed Care - PPO | Admitting: Internal Medicine

## 2019-03-24 ENCOUNTER — Ambulatory Visit
Admission: RE | Admit: 2019-03-24 | Discharge: 2019-03-24 | Disposition: A | Discharge status: Home / Self Care | Payer: Commercial Managed Care - PPO | Source: Ambulatory Visit | Attending: Internal Medicine | Admitting: Internal Medicine

## 2019-03-24 ENCOUNTER — Other Ambulatory Visit: Payer: Commercial Managed Care - PPO

## 2019-03-24 ENCOUNTER — Other Ambulatory Visit: Payer: Self-pay

## 2019-03-24 DIAGNOSIS — R748 Abnormal levels of other serum enzymes: Secondary | ICD-10-CM

## 2019-03-24 DIAGNOSIS — M79622 Pain in left upper arm: Secondary | ICD-10-CM | POA: Insufficient documentation

## 2019-03-24 DIAGNOSIS — D179 Benign lipomatous neoplasm, unspecified: Secondary | ICD-10-CM

## 2019-03-24 MED ORDER — GADOBUTROL 1 MMOL/ML IV SOLN
8.0000 mL | Freq: Once | INTRAVENOUS | Status: AC | PRN
  Administered 2019-03-24: 8 mL via INTRAVENOUS

## 2019-03-30 ENCOUNTER — Telehealth: Payer: Self-pay

## 2019-03-30 NOTE — Telephone Encounter (Signed)
Pt called and stated he has a rash between his legs that he was treated for before and it did not go away, he stated he has a upcoming appt with the provider and wanted to see if he  Could discuss this with the provider at that appt,becaue the appt is scheduled for something else.  I informed the patient that if he feels like it is not an emergency then yes he could have that rash addressed at his next visit.  Pt understood.  French Kendra,cma

## 2019-03-31 ENCOUNTER — Ambulatory Visit: Payer: Commercial Managed Care - PPO | Admitting: Internal Medicine

## 2019-04-07 ENCOUNTER — Encounter: Payer: Self-pay | Admitting: Internal Medicine

## 2019-04-07 ENCOUNTER — Other Ambulatory Visit: Payer: Self-pay

## 2019-04-07 ENCOUNTER — Other Ambulatory Visit (INDEPENDENT_AMBULATORY_CARE_PROVIDER_SITE_OTHER): Payer: Commercial Managed Care - PPO

## 2019-04-07 ENCOUNTER — Other Ambulatory Visit: Payer: Self-pay | Admitting: Internal Medicine

## 2019-04-07 DIAGNOSIS — Z1329 Encounter for screening for other suspected endocrine disorder: Secondary | ICD-10-CM

## 2019-04-07 DIAGNOSIS — Z113 Encounter for screening for infections with a predominantly sexual mode of transmission: Secondary | ICD-10-CM

## 2019-04-07 DIAGNOSIS — E785 Hyperlipidemia, unspecified: Secondary | ICD-10-CM | POA: Insufficient documentation

## 2019-04-07 DIAGNOSIS — E559 Vitamin D deficiency, unspecified: Secondary | ICD-10-CM | POA: Diagnosis not present

## 2019-04-07 DIAGNOSIS — E119 Type 2 diabetes mellitus without complications: Secondary | ICD-10-CM

## 2019-04-07 LAB — LDL CHOLESTEROL, DIRECT: Direct LDL: 108 mg/dL

## 2019-04-07 LAB — COMPREHENSIVE METABOLIC PANEL
ALT: 40 U/L (ref 0–53)
AST: 18 U/L (ref 0–37)
Albumin: 4.3 g/dL (ref 3.5–5.2)
Alkaline Phosphatase: 114 U/L (ref 39–117)
BUN: 16 mg/dL (ref 6–23)
CO2: 25 mEq/L (ref 19–32)
Calcium: 9.3 mg/dL (ref 8.4–10.5)
Chloride: 100 mEq/L (ref 96–112)
Creatinine, Ser: 0.79 mg/dL (ref 0.40–1.50)
GFR: 110.78 mL/min (ref >60.00)
Glucose, Bld: 218 mg/dL — ABNORMAL HIGH (ref 70–99)
Potassium: 4 mEq/L (ref 3.5–5.1)
Sodium: 136 mEq/L (ref 135–145)
Total Bilirubin: 0.8 mg/dL (ref 0.2–1.2)
Total Protein: 6.6 g/dL (ref 6.0–8.3)

## 2019-04-07 LAB — CBC WITH DIFFERENTIAL/PLATELET
Basophils Absolute: 0 10*3/uL (ref 0.0–0.1)
Basophils Relative: 0.5 % (ref 0.0–3.0)
Eosinophils Absolute: 0.1 10*3/uL (ref 0.0–0.7)
Eosinophils Relative: 1.9 % (ref 0.0–5.0)
HCT: 41.7 % (ref 39.0–52.0)
Hemoglobin: 14.2 g/dL (ref 13.0–17.0)
Lymphocytes Relative: 33.4 % (ref 12.0–46.0)
Lymphs Abs: 1.8 10*3/uL (ref 0.7–4.0)
MCHC: 34 g/dL (ref 30.0–36.0)
MCV: 85.2 fl (ref 78.0–100.0)
Monocytes Absolute: 0.4 10*3/uL (ref 0.1–1.0)
Monocytes Relative: 6.9 % (ref 3.0–12.0)
Neutro Abs: 3.1 10*3/uL (ref 1.4–7.7)
Neutrophils Relative %: 57.3 % (ref 43.0–77.0)
Platelets: 295 10*3/uL (ref 150.0–400.0)
RBC: 4.89 Mil/uL (ref 4.22–5.81)
RDW: 12.7 % (ref 11.5–15.5)
WBC: 5.4 10*3/uL (ref 4.0–10.5)

## 2019-04-07 LAB — LIPID PANEL
Cholesterol: 169 mg/dL (ref 0–200)
HDL: 31.6 mg/dL — ABNORMAL LOW (ref >39.00)
NonHDL: 137.31
Total CHOL/HDL Ratio: 5
Triglycerides: 217 mg/dL — ABNORMAL HIGH (ref 0.0–149.0)
VLDL: 43.4 mg/dL — ABNORMAL HIGH (ref 0.0–40.0)

## 2019-04-07 LAB — TSH: TSH: 1.48 u[IU]/mL (ref 0.35–4.50)

## 2019-04-07 LAB — VITAMIN D 25 HYDROXY (VIT D DEFICIENCY, FRACTURES): VITD: 18.11 ng/mL — ABNORMAL LOW (ref 30.00–100.00)

## 2019-04-07 LAB — HEMOGLOBIN A1C: Hgb A1c MFr Bld: 10.5 % — ABNORMAL HIGH (ref 4.6–6.5)

## 2019-04-07 MED ORDER — CHOLECALCIFEROL 1.25 MG (50000 UT) PO CAPS: 50000.0000 [IU] | ORAL_CAPSULE | ORAL | 1 refills | Status: DC

## 2019-04-07 NOTE — Addendum Note (Signed)
Addended by: Leeanne Rio on: 04/07/2019 11:27 AM   Modules accepted: Orders

## 2019-04-08 LAB — PROTEIN / CREATININE RATIO, URINE
Creatinine, Urine: 208 mg/dL (ref 20–320)
Protein/Creat Ratio: 144 mg/g creat — ABNORMAL HIGH (ref 22–128)
Protein/Creatinine Ratio: 0.144 mg/mg creat — ABNORMAL HIGH (ref 0.022–0.12)
Total Protein, Urine: 30 mg/dL — ABNORMAL HIGH (ref 5–25)

## 2019-04-08 LAB — URINALYSIS, ROUTINE W REFLEX MICROSCOPIC
Bacteria, UA: NONE SEEN /HPF
Bilirubin Urine: NEGATIVE
Hgb urine dipstick: NEGATIVE
Hyaline Cast: NONE SEEN /LPF
Ketones, ur: NEGATIVE
Leukocytes,Ua: NEGATIVE
Nitrite: NEGATIVE
Specific Gravity, Urine: 1.03 (ref 1.001–1.03)
Squamous Epithelial / LPF: NONE SEEN /HPF (ref <=5)
pH: 5.5 (ref 5.0–8.0)

## 2019-04-08 LAB — HIV ANTIBODY (ROUTINE TESTING W REFLEX): HIV 1&2 Ab, 4th Generation: NONREACTIVE

## 2019-04-13 ENCOUNTER — Encounter: Payer: Self-pay | Admitting: Internal Medicine

## 2019-04-13 DIAGNOSIS — K760 Fatty (change of) liver, not elsewhere classified: Secondary | ICD-10-CM | POA: Insufficient documentation

## 2019-04-13 MED ORDER — CLOBETASOL PROPIONATE 0.05 % EX CREA: 1.0000 "application " | TOPICAL_CREAM | Freq: Two times a day (BID) | CUTANEOUS | 2 refills | Status: DC

## 2019-04-13 MED ORDER — JARDIANCE 10 MG PO TABS: 10.0000 mg | ORAL_TABLET | Freq: Every day | ORAL | 2 refills | Status: DC

## 2019-04-13 MED ORDER — METFORMIN HCL 500 MG PO TABS: 500.0000 mg | ORAL_TABLET | Freq: Every day | ORAL | 3 refills | Status: DC

## 2019-04-13 MED ORDER — MICONAZOLE NITRATE 2 % EX CREA: 1.0000 "application " | TOPICAL_CREAM | Freq: Two times a day (BID) | CUTANEOUS | 11 refills | Status: DC

## 2019-04-13 NOTE — Addendum Note (Signed)
Addended by: Orland Mustard on: 04/13/2019 06:24 PM   Modules accepted: Orders

## 2019-04-14 ENCOUNTER — Other Ambulatory Visit: Payer: Self-pay

## 2019-04-14 ENCOUNTER — Ambulatory Visit: Payer: Commercial Managed Care - PPO | Admitting: Internal Medicine

## 2019-04-14 ENCOUNTER — Other Ambulatory Visit: Payer: Self-pay | Admitting: Internal Medicine

## 2019-04-14 ENCOUNTER — Telehealth: Payer: Self-pay | Admitting: Internal Medicine

## 2019-04-14 DIAGNOSIS — E1165 Type 2 diabetes mellitus with hyperglycemia: Secondary | ICD-10-CM

## 2019-04-14 MED ORDER — ACCU-CHEK SOFT TOUCH LANCETS MISC: 12 refills | Status: AC

## 2019-04-14 MED ORDER — GLUCOSE BLOOD VI STRP: ORAL_STRIP | 12 refills | Status: AC

## 2019-04-14 NOTE — Telephone Encounter (Signed)
I called pt and left a vm to call ofc to resch appt for 3 month.

## 2019-04-28 ENCOUNTER — Ambulatory Visit: Payer: Commercial Managed Care - PPO | Admitting: Surgery

## 2019-04-28 ENCOUNTER — Other Ambulatory Visit: Payer: Commercial Managed Care - PPO

## 2019-05-05 ENCOUNTER — Ambulatory Visit: Payer: Commercial Managed Care - PPO | Admitting: Surgery

## 2019-05-05 ENCOUNTER — Encounter: Payer: Self-pay | Admitting: Surgery

## 2019-05-05 ENCOUNTER — Ambulatory Visit: Payer: Commercial Managed Care - PPO | Admitting: Internal Medicine

## 2019-05-05 ENCOUNTER — Other Ambulatory Visit: Payer: Self-pay

## 2019-05-05 VITALS — BP 123/81 | HR 80 | Temp 97.4°F | Ht 64.0 in | Wt 175.0 lb

## 2019-05-05 DIAGNOSIS — D179 Benign lipomatous neoplasm, unspecified: Secondary | ICD-10-CM | POA: Diagnosis not present

## 2019-05-05 NOTE — Progress Notes (Signed)
05/05/2019  Reason for Visit: Multiple lipomas  Referring provider: Orland Mustard, MD  History of Present Illness: Bill Herrera is a 38 y.o. male presenting for evaluation of multiple lipomas.  Patient reports that he has had them for a long time and denies any rapid growth.  Reports that he has lipomas of bilateral upper extremities as well as the left abdominal wall he is noticed 1 also in the back of his neck.  He denies any significant symptoms but does describe soreness particularly in the left abdominal wall he also reports that if he sleeps with his head on his right or left arm, pain at site of the lipomas that he has.  Denies any drainage, redness of the skin, induration, or other concerns.  Patient had an ultrasound of the abdomen as well as an MRI of his left upper extremity which were negative for any masses or nodules.  Past Medical History: Past Medical History:  Diagnosis Date  . Diabetes mellitus without complication ([place])   . Frequent headaches      Past Surgical History: Past Surgical History:  Procedure Laterality Date  . right cyst removal     right forehead     Home Medications: Prior to Admission medications   Medication Sig Start Date End Date Taking? Authorizing Provider  Cholecalciferol 1.25 MG (50000 UT) capsule Take 1 capsule (50,000 Units total) by mouth once a week. 04/07/19  Yes McLean-Scocuzza, Nino Glow, MD  clobetasol cream (TEMOVATE) 3.71 % Apply 1 application topically 2 (two) times daily. Right thigh x 1-2 weeks 04/13/19  Yes McLean-Scocuzza, Nino Glow, MD  empagliflozin (JARDIANCE) 10 MG TABS tablet Take 10 mg by mouth daily. In am 04/13/19  Yes McLean-Scocuzza, Nino Glow, MD  glucose blood test strip Use as instructed E11.9. Accu chek guid test strips check blood sugar in am before breakfast, after lunch and after dinner 04/14/19  Yes McLean-Scocuzza, Nino Glow, MD  Lancets (ACCU-CHEK SOFT TOUCH) lancets E 11.9 Use as instructed check blood sugar 3x per  day 04/14/19  Yes McLean-Scocuzza, Nino Glow, MD  metFORMIN (GLUCOPHAGE) 500 MG tablet Take 1 tablet (500 mg total) by mouth daily with breakfast. 04/13/19  Yes McLean-Scocuzza, Nino Glow, MD  miconazole (MICATIN) 2 % cream Apply 1 application topically 2 (two) times daily. To groin x 1-2 weeks as needed 04/13/19  Yes McLean-Scocuzza, Nino Glow, MD    Allergies: No Known Allergies  Social History:  reports that he has never smoked. He has never used smokeless tobacco. He reports current alcohol use. He reports previous drug use.   Family History: Family History  Problem Relation Age of Onset  . Asthma Father   . Diabetes Father        2  . Hypertension Father     Review of Systems: Review of Systems  Constitutional: Negative for chills and fever.  HENT: Negative for hearing loss.   Eyes: Negative for blurred vision.  Respiratory: Negative for shortness of breath.   Cardiovascular: Negative for chest pain.  Gastrointestinal: Negative for nausea and vomiting.  Genitourinary: Negative for dysuria.  Musculoskeletal: Negative for myalgias.  Skin: Negative for rash.  Neurological: Negative for dizziness.  Psychiatric/Behavioral: Negative for depression.    Physical Exam BP 123/81   Pulse 80   Temp (!) 97.4 F (36.3 C) (Skin)   Ht 5\' 4"  (1.626 m)   Wt 175 lb (79.4 kg)   SpO2 98%   BMI 30.04 kg/m  CONSTITUTIONAL: No acute distress HEENT:  Normocephalic,  atraumatic, extraocular motion intact. NECK: Trachea is midline, and there is no jugular venous distension.  RESPIRATORY:  Lungs are clear, and breath sounds are equal bilaterally. Normal respiratory effort without pathologic use of accessory muscles. CARDIOVASCULAR: Heart is regular without murmurs, gallops, or rubs. GI: The abdomen is soft, nondistended, nontender.  MUSCULOSKELETAL:  Normal muscle strength and tone in all four extremities.  No peripheral edema or cyanosis. SKIN: Patient has multiple lipomas in the bilateral upper  extremities at the level of the humerus on both sides measuring about 1 cm each, as well as multiple lipomas in the left lateral abdominal wall.  In this area, I can palpate about 5 small lipomas ranging from 1 cm to 2 cm in size.  He also has 1 small lipoma right side posterior neck 1 cm. NEUROLOGIC:  Motor and sensation is grossly normal.  Cranial nerves are grossly intact. PSYCH:  Alert and oriented to person, place and time. Affect is normal.  Laboratory Analysis: No results found for this or any previous visit (from the past 24 hour(s)).  Imaging: No results found.  Assessment and Plan: This is a 38 y.o. male with multiple lipomas of the bilateral upper extremities, left lateral abdominal wall, and posterior neck.  - The patient reports that more symptomatic ones are currently the ones in the left lateral abdominal wall and he would want to proceed with resection of these.  I gave the patient the option of doing this whether in the office or in the operating room where he would have full anesthesia.  He is very comfortable doing this in the office in the procedure room we will schedule him for 05/19/2019 for resection of this.  Discussed with him that we would use numbing medication to help with pain control for the procedure and discussed the procedures at length.  Discussed with him also postoperative outcomes and restrictions particularly with his line of work and mobility.  He understands these and is willing to proceed.  Face-to-face time spent with the patient and care providers was 60 minutes, with more than 50% of the time spent counseling, educating, and coordinating care of the patient.     Melvyn Neth, [facility]

## 2019-05-05 NOTE — Patient Instructions (Addendum)
Return in 2 weeks excision

## 2019-05-12 ENCOUNTER — Encounter: Payer: Commercial Managed Care - PPO | Attending: Internal Medicine | Admitting: *Deleted

## 2019-05-12 ENCOUNTER — Other Ambulatory Visit: Payer: Self-pay

## 2019-05-12 ENCOUNTER — Encounter: Payer: Self-pay | Admitting: *Deleted

## 2019-05-12 VITALS — BP 110/78 | Ht 65.0 in | Wt 170.7 lb

## 2019-05-12 DIAGNOSIS — Z713 Dietary counseling and surveillance: Secondary | ICD-10-CM | POA: Insufficient documentation

## 2019-05-12 DIAGNOSIS — E119 Type 2 diabetes mellitus without complications: Secondary | ICD-10-CM | POA: Diagnosis not present

## 2019-05-12 NOTE — Patient Instructions (Signed)
Check blood sugars 1 x day before breakfast or 2 hrs after one meal every day Bring blood sugar records to the next appointment/class  Exercise: Continue running  for    20  minutes   2  days a week and gradually increase exercise for 30 minutes 5 x week  Eat 3 meals day,  1-2 snacks a day Space meals 4-6 hours apart Avoid sugar sweetened drinks (tea, juices) Complete 3 Day Food Record and bring to next appt  Make an eye doctor appointment  Return for appointmen on:  June 23, 2019 at 10:30 am with [facility] (dietitian)

## 2019-05-12 NOTE — Progress Notes (Signed)
Diabetes Self-Management Education  Visit Type: First/Initial  Appt. Start Time: 1020 Appt. End Time: 6010  05/12/2019  [name], identified by name and date of birth, is a 38 y.o. male with a diagnosis of Diabetes: Type 2.   ASSESSMENT  Blood pressure 110/78, height 5\' 5"  (1.651 m), weight 170 lb 11.2 oz (77.4 kg). Body mass index is 28.41 kg/m.  Diabetes Self-Management Education - 05/12/19 1148      Visit Information   Visit Type  First/Initial      Initial Visit   Diabetes Type  Type 2    Are you currently following a meal plan?  No    Are you taking your medications as prescribed?  Yes    Date Diagnosed  3 months ago      Health Coping   How would you rate your overall health?  Good      Psychosocial Assessment   Patient Belief/Attitude about Diabetes  Motivated to manage diabetes    Self-care barriers  None    Self-management support  Doctor's office;Friends    Patient Concerns  Nutrition/Meal planning;Medication;Glycemic Control;Monitoring    Special Needs  None    Preferred Learning Style  Auditory    Learning Readiness  Ready    How often do you need to have someone help you when you read instructions, pamphlets, or other written materials from your doctor or pharmacy?  1 - Never    What is the last grade level you completed in school?  high school      Pre-Education Assessment   Patient understands the diabetes disease and treatment process.  Needs Instruction    Patient understands incorporating nutritional management into lifestyle.  Needs Instruction    Patient undertands incorporating physical activity into lifestyle.  Needs Instruction    Patient understands using medications safely.  Needs Instruction    Patient understands monitoring blood glucose, interpreting and using results  Needs Review    Patient understands prevention, detection, and treatment of acute complications.  Needs Instruction    Patient understands prevention, detection, and  treatment of chronic complications.  Needs Instruction    Patient understands how to develop strategies to address psychosocial issues.  Needs Instruction    Patient understands how to develop strategies to promote health/change behavior.  Needs Instruction      Complications   Last HgB A1C per patient/outside source  10.5 %   04/07/2019   How often do you check your blood sugar?  3-4 times / week   Pt hasn't checked in 2-3 days. BG in the office was 123 mg/dL at 11:30 am - fasting.   Postprandial Blood glucose range (mg/dL)  --   He reports random blood sugars have been in th 200's mg/dL.   Have you had a dilated eye exam in the past 12 months?  No    Have you had a dental exam in the past 12 months?  No    Are you checking your feet?  Yes    How many days per week are you checking your feet?  4      Dietary Intake   Breakfast  eats a banana or apple then chick-fil-a sandwich - grilled chicken with no fries    Snack (morning)  reports that he doesn't eat snacks    Lunch  eats out for most meals when working - [place] - chicken, cheese and rice; [employer] 6" chicken sandwich; Research scientist (physical sciences) at [employer]; [employer] smoked chicken with  slaw and potatoes    Dinner  chicken, beef, tortillas, corn, beans, broccoli, cauliflower, lettuce, tomatoes, cuccumbers, spinach    Beverage(s)  water, juice, tea sweetened with lemonade      Exercise   Exercise Type  Strenuous (running)    How many days per week to you exercise?  2    How many minutes per day do you exercise?  20    Total minutes per week of exercise  40      Patient Education   Previous Diabetes Education  No    Disease state   Definition of diabetes, type 1 and 2, and the diagnosis of diabetes;Factors that contribute to the development of diabetes;Explored patient's options for treatment of their diabetes    Nutrition management   Role of diet in the treatment of diabetes and the relationship between the three main macronutrients and blood  glucose level;Food label reading, portion sizes and measuring food.;Reviewed blood glucose goals for pre and post meals and how to evaluate the patients' food intake on their blood glucose level.;Information on hints to eating out and maintain blood glucose control.    Physical activity and exercise   Role of exercise on diabetes management, blood pressure control and cardiac health.    Medications  Reviewed patients medication for diabetes, action, purpose, timing of dose and side effects.    Monitoring  Purpose and frequency of SMBG.;Taught/discussed recording of test results and interpretation of SMBG.;Identified appropriate SMBG and/or A1C goals.;Yearly dilated eye exam    Chronic complications  Relationship between chronic complications and blood glucose control    Psychosocial adjustment  Identified and addressed patients feelings and concerns about diabetes      Individualized Goals (developed by patient)   Health Coping  Improve blood sugars Decrease medications Prevent diabetes complications     Outcomes   Expected Outcomes  Demonstrated interest in learning. Expect positive outcomes    Future DMSE  4-6 wks       Individualized Plan for Diabetes Self-Management Training:   Learning Objective:  Patient will have a greater understanding of diabetes self-management. Patient education plan is to attend individual and/or group sessions per assessed needs and concerns.   Plan:   Patient Instructions  Check blood sugars 1 x day before breakfast or 2 hrs after one meal every day Bring blood sugar records to the next appointment/class Exercise: Continue running  for    20  minutes   2  days a week and gradually increase exercise for 30 minutes 5 x week Eat 3 meals day,  1-2 snacks a day Space meals 4-6 hours apart Avoid sugar sweetened drinks (tea, juices) Complete 3 Day Food Record and bring to next appt Make an eye doctor appointment Return for appointmen on:  June 23, 2019 at  10:30 am with [facility] (dietitian)  Expected Outcomes:  Demonstrated interest in learning. Expect positive outcomes  Education material provided:  General Meal Planning Guidelines Simple Meal Plan 3 Day Food Record A1C chart  If problems or questions, patient to contact team via:  Johny Drilling, [place], [place], CDE [phone]  Future DSME appointment: 4-6 wks  Patient unable to attend Diabetes classes due to working out of town except on Fridays. Next appointment scheduled with the dietitian on Sept 4, 2020.

## 2019-05-19 ENCOUNTER — Ambulatory Visit: Payer: Commercial Managed Care - PPO | Admitting: Surgery

## 2019-06-02 ENCOUNTER — Ambulatory Visit (INDEPENDENT_AMBULATORY_CARE_PROVIDER_SITE_OTHER): Payer: Commercial Managed Care - PPO | Admitting: Surgery

## 2019-06-02 ENCOUNTER — Encounter: Payer: Self-pay | Admitting: Surgery

## 2019-06-02 ENCOUNTER — Other Ambulatory Visit: Payer: Self-pay

## 2019-06-02 VITALS — BP 118/76 | HR 67 | Temp 97.5°F | Ht 65.0 in | Wt 171.8 lb

## 2019-06-02 DIAGNOSIS — D179 Benign lipomatous neoplasm, unspecified: Secondary | ICD-10-CM

## 2019-06-02 MED ORDER — OXYCODONE HCL 5 MG PO TABS: 5.0000 mg | ORAL_TABLET | ORAL | 0 refills | Status: DC | PRN

## 2019-06-02 NOTE — Patient Instructions (Addendum)
Today we have removed a Lipoma in our office. Please see information below regarding this type of tumor.  You are free to shower in 48 hours. This will be on Sunday August 16th.   You have glue on your skin and sutures under the skin. The glue will come off on it's own in 10-14 days. You may shower normally until this occurs but do not submerge.  Please use Tylenol or Ibuprofen for pain as needed and also ice/cold pack to the areas.  Use prescription pain medication for severe pain.   We will see you back in 2 weeks to ensure that this has healed and to review the final pathology. Please see your appointment below. You may continue your regular activities right away but if you are having pain while doing something, stop what you are doing and try this activity once again in 3 days. Please call our office with any questions or concerns prior to your appointment.     Lipoma  A lipoma is a noncancerous (benign) tumor that is made up of fat cells. This is a very common type of soft-tissue growth. Lipomas are usually found under the skin (subcutaneous). They may occur in any tissue of the body that contains fat. Common areas for lipomas to appear include the back, shoulders, buttocks, and thighs.  Lipomas grow slowly, and they are usually painless. Most lipomas do not cause problems and do not require treatment. What are the causes? The cause of this condition is not known. What increases the risk? You are more likely to develop this condition if:  You are 17-44 years old.  You have a family history of lipomas. What are the signs or symptoms? A lipoma usually appears as a small, round bump under the skin. In most cases, the lump will:  Feel soft or rubbery.  Not cause pain or other symptoms. However, if a lipoma is located in an area where it pushes on nerves, it can become painful or cause other symptoms. How is this diagnosed? A lipoma can usually be diagnosed with a physical exam. You  may also have tests to confirm the diagnosis and to rule out other conditions. Tests may include:  Imaging tests, such as a CT scan or MRI.  Removal of a tissue sample to be looked at under a microscope (biopsy). How is this treated? Treatment for this condition depends on the size of the lipoma and whether it is causing any symptoms.  For small lipomas that are not causing problems, no treatment is needed.  If a lipoma is bigger or it causes problems, surgery may be done to remove the lipoma. Lipomas can also be removed to improve appearance. Most often, the procedure is done after applying a medicine that numbs the area (local anesthetic). Follow these instructions at home:  Watch your lipoma for any changes.  Keep all follow-up visits as told by your health care provider. This is important. Contact a health care provider if:  Your lipoma becomes larger or hard.  Your lipoma becomes painful, red, or increasingly swollen. These could be signs of infection or a more serious condition. Get help right away if:  You develop tingling or numbness in an area near the lipoma. This could indicate that the lipoma is causing nerve damage. Summary  A lipoma is a noncancerous tumor that is made up of fat cells.  Most lipomas do not cause problems and do not require treatment.  If a lipoma is bigger or it  causes problems, surgery may be done to remove the lipoma. This information is not intended to replace advice given to you by your health care provider. Make sure you discuss any questions you have with your health care provider. Document Released: 09/25/2002 Document Revised: 09/21/2017 Document Reviewed: 09/21/2017 Elsevier Patient Education  [place].

## 2019-06-02 NOTE — Progress Notes (Signed)
  Procedure Date:  06/02/2019  Pre-operative Diagnosis:  Multiple lipomas of left abdominal wall  Post-operative Diagnosis:  Multiple lipomas of left abdominal wall  Procedure:  Excision of multiple lipomas of left abdominal wall -- total of 4 incisions.  Surgeon:  Melvyn Neth, MD  Anesthesia:  25 ml 1% lidocaine with epi  Estimated Blood Loss:  15 ml  Specimens:  4 fatty tissue specimens from 4 incision sites  Complications:  None  Indications for Procedure:  This is a 38 y.o. male with diagnosis of multiple symptomatic lipomas of the left abdominal wall.  The patient wishes to have them excised. The risks of bleeding, abscess or infection, injury to surrounding structures, and need for further procedures were all discussed with the patient and he was willing to proceed.  Description of Procedure: The patient was correctly identified at bedside.  The patient was placed in right lateral decubitus position.  Appropriate time-outs were performed.  The patient's left lower abdomen was prepped and draped in usual sterile fashion.  Started with the most posterior lesion, at about the level of the posterior axillary line.  The area of incision was infiltrated with local anesthetic.  A 3 cm incision was made over the lipoma.  Skin flaps were created sharply with the scalpel and then the lipoma was excised sharply using combination of scalpel and Metzenbaum scissors.  It was sent off to pathology.  The cavity was then packed with gauze.  Then the same was done for the next lipoma, at the level of the left anterior axillary line.  The lipoma was removed similarly and the wound was also packed.  The next lipoma was at the anterior flank, about the level of the umbilicus.  This was resected similarly and the wound was packed.  The last incision comprised three small lipomas.  All three were excised sharply as one larger specimen.  The cavity was packed with gauze.  Then we proceeded to the first  incision again.  The cavity was irrigated and then closed in two layers with 3-0 Vicryl and 4-0 Monocryl.  The 2nd incision was irrigated and closed in the same fashion.  The 3rd incision was then irrigated and closed.  The 4th incision was then irrigated and closed.  All the wound were then cleaned and sealed with DermaBond.  The patient tolerated the procedure well and all sharps were appropriately disposed of at the end of the case.   --Instructed the patient that it is normal for there to be swelling, bruising and puffiness of the wounds due to fluid.  However, if there is any worsening pain, wound breakdown, or leakage of fluid, he should contact us so we can evaluate the wounds.   --He may take Tylenol and Ibuprofen and we will given him a prescription for Oxycodone as well. --Follow up in 2 weeks with Mr. Olean Ree.   Melvyn Neth, MD

## 2019-06-16 ENCOUNTER — Encounter: Payer: Commercial Managed Care - PPO | Admitting: Physician Assistant

## 2019-06-22 ENCOUNTER — Encounter: Payer: Self-pay | Admitting: Dietician

## 2019-06-22 NOTE — Progress Notes (Signed)
Patient rescheduled his RD appointment from 06/23/19 to 07/14/19.

## 2019-06-23 ENCOUNTER — Ambulatory Visit: Payer: Commercial Managed Care - PPO | Admitting: Dietician

## 2019-06-30 ENCOUNTER — Encounter: Payer: Self-pay | Admitting: Physician Assistant

## 2019-06-30 ENCOUNTER — Other Ambulatory Visit: Payer: Self-pay

## 2019-06-30 ENCOUNTER — Ambulatory Visit (INDEPENDENT_AMBULATORY_CARE_PROVIDER_SITE_OTHER): Payer: Commercial Managed Care - PPO | Admitting: Physician Assistant

## 2019-06-30 VITALS — BP 114/73 | HR 62 | Temp 97.7°F | Ht 65.0 in | Wt 174.0 lb

## 2019-06-30 DIAGNOSIS — D179 Benign lipomatous neoplasm, unspecified: Secondary | ICD-10-CM

## 2019-06-30 DIAGNOSIS — Z09 Encounter for follow-up examination after completed treatment for conditions other than malignant neoplasm: Secondary | ICD-10-CM

## 2019-06-30 NOTE — Progress Notes (Signed)
[facility] POST-OP OFFICE VISIT  06/30/2019  HPI: Bill Herrera is a 38 y.o. male 28 days s/p Excision (x4) of multiple lipomas of left abdominal wall  Overall, He reports he is doing well. No issues with pain, No fevers, chills, or drainage. He did note a corner of one of his incisions had opened up but has closed over with a scab. No other complaints.   Vital signs: BP 114/73   Pulse 62   Temp 97.7 F (36.5 C)   Ht 5\' 5"  (1.651 m)   Wt 174 lb (78.9 kg)   SpO2 98%   BMI 28.96 kg/m    Physical Exam: Constitutional: Well appearing male, NAD Skin: 4 incisions to the left lateral abdominal wall, theses are all well healed, no erythema, no drainage  Assessment/Plan: This is a 38 y.o. male 28 days s/p Excision (x4) of multiple lipomas of left abdominal wall   - pain control prn  - okay to submerge wounds  - Pathology reviewed: Lipomas, negative for malignancy  - rtc prn  -- Edison Simon, PA-C [facility] 06/30/2019, 10:28 AM [phone] M-F: 7am - 4pm

## 2019-06-30 NOTE — Patient Instructions (Signed)
   Follow-up with our office as needed.  Please call and ask to speak with a nurse if you develop questions or concerns.  

## 2019-07-12 ENCOUNTER — Encounter: Payer: Self-pay | Admitting: Dietician

## 2019-07-12 NOTE — Progress Notes (Signed)
Sent notification to referring provider of patient's cancellation and incomplete diabetes education.

## 2019-07-14 ENCOUNTER — Ambulatory Visit: Payer: Commercial Managed Care - PPO | Admitting: Dietician

## 2019-07-21 ENCOUNTER — Ambulatory Visit: Payer: Commercial Managed Care - PPO | Admitting: Internal Medicine

## 2019-08-22 ENCOUNTER — Other Ambulatory Visit: Payer: Self-pay | Admitting: Internal Medicine

## 2019-08-22 DIAGNOSIS — E119 Type 2 diabetes mellitus without complications: Secondary | ICD-10-CM

## 2019-08-22 MED ORDER — JARDIANCE 10 MG PO TABS: 10.0000 mg | ORAL_TABLET | Freq: Every day | ORAL | 2 refills | Status: DC

## 2019-09-22 ENCOUNTER — Encounter: Payer: Self-pay | Admitting: Internal Medicine

## 2019-09-22 ENCOUNTER — Ambulatory Visit (INDEPENDENT_AMBULATORY_CARE_PROVIDER_SITE_OTHER): Payer: Commercial Managed Care - PPO | Admitting: Internal Medicine

## 2019-09-22 VITALS — Ht 65.0 in | Wt 170.0 lb

## 2019-09-22 DIAGNOSIS — Z1159 Encounter for screening for other viral diseases: Secondary | ICD-10-CM

## 2019-09-22 DIAGNOSIS — Z13818 Encounter for screening for other digestive system disorders: Secondary | ICD-10-CM

## 2019-09-22 DIAGNOSIS — Z0000 Encounter for general adult medical examination without abnormal findings: Secondary | ICD-10-CM | POA: Diagnosis not present

## 2019-09-22 DIAGNOSIS — E119 Type 2 diabetes mellitus without complications: Secondary | ICD-10-CM | POA: Diagnosis not present

## 2019-09-22 DIAGNOSIS — E559 Vitamin D deficiency, unspecified: Secondary | ICD-10-CM

## 2019-09-22 DIAGNOSIS — M79672 Pain in left foot: Secondary | ICD-10-CM

## 2019-09-22 DIAGNOSIS — M79671 Pain in right foot: Secondary | ICD-10-CM

## 2019-09-22 DIAGNOSIS — K760 Fatty (change of) liver, not elsewhere classified: Secondary | ICD-10-CM

## 2019-09-22 MED ORDER — JARDIANCE 10 MG PO TABS: 10.0000 mg | ORAL_TABLET | Freq: Every day | ORAL | 3 refills | Status: DC

## 2019-09-22 MED ORDER — METFORMIN HCL 500 MG PO TABS: 500.0000 mg | ORAL_TABLET | Freq: Every day | ORAL | 3 refills | Status: DC

## 2019-09-22 MED ORDER — CHOLECALCIFEROL 1.25 MG (50000 UT) PO CAPS: 50000.0000 [IU] | ORAL_CAPSULE | ORAL | 0 refills | Status: DC

## 2019-09-22 NOTE — Progress Notes (Signed)
Virtual Visit via Video Note  I connected with Bill Herrera  on 09/22/19 at  4:00 PM EST by a video enabled telemedicine application and verified that I am speaking with the correct person using two identifiers.  Location patient: home Location provider:work or home office Persons participating in the virtual visit: patient, provider  I discussed the limitations of evaluation and management by telemedicine and the availability of in person appointments. The patient expressed understanding and agreed to proceed.   HPI: annual 1. DM 2 A1C 10.5 on jardiance 10 and metformin 500 mg qd cbgs 118-120s wt is 165-170 2. C/o b/l foot pain saw podiatry Xrays left foot and right heel negative and rec rheumatology to consider if foot pain did not go away per Dr. Vickki Muff at times his feet swell with pain and feel still    ROS: See pertinent positives and negatives per HPI. General: wt 165-170s lbs Heent: no sore throat  Cv: no chest pain  Lungs: no sob  GI: no ab pain since lipoma removed  GU: no issues  MSK: no joint pain  Neuro: no h/a  Skin: lipomas x 12 left  Psych: no anxiety/depression  Past Medical History:  Diagnosis Date  . Diabetes mellitus without complication ([place])   . Frequent headaches   . Lipoma    x4 removed with 12 more as of 09/22/2019    Past Surgical History:  Procedure Laterality Date  . right cyst removal     right forehead     Family History  Problem Relation Age of Onset  . Asthma Father   . Diabetes Father        2  . 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  empagliflozin (JARDIANCE) 10 MG TABS tablet, Take 10 mg by mouth daily. In am, Disp: 90 tablet, Rfl: 3 .  glucose blood test strip, Use as instructed E11.9. Accu chek guid test strips check blood sugar in am before breakfast, after lunch and after dinner, Disp: 300 each, Rfl: 12 .  Lancets (ACCU-CHEK SOFT TOUCH) lancets, E 11.9 Use as  instructed check blood sugar 3x per day, Disp: 300 each, Rfl: 12 .  metFORMIN (GLUCOPHAGE) 500 MG tablet, Take 1 tablet (500 mg total) by mouth daily with breakfast., Disp: 90 tablet, Rfl: 3 .  Cholecalciferol 1.25 MG (50000 UT) capsule, Take 1 capsule (50,000 Units total) by mouth once a week., Disp: 13 capsule, Rfl: 0 .  clobetasol cream (TEMOVATE) [id number] %, Apply 1 application topically 2 (two) times daily. Right thigh x 1-2 weeks (Patient not taking: Reported on 09/22/2019), Disp: 45 g, Rfl: 2  EXAM:  VITALS per patient if applicable:  GENERAL: alert, oriented, appears well and in no acute distress  HEENT: atraumatic, conjunttiva clear, no obvious abnormalities on inspection of external nose and ears  NECK: normal movements of the head and neck  LUNGS: on inspection no signs of respiratory distress, breathing rate appears normal, no obvious gross SOB, gasping or wheezing  CV: no obvious cyanosis  MS: moves all visible extremities without noticeable abnormality  PSYCH/NEURO: pleasant and cooperative, no obvious depression or anxiety, speech and thought processing grossly intact  ASSESSMENT AND PLAN:  Discussed the following assessment and plan:  Annual physical exam Flu will get with fasting labs  sch fasting labs asap tdap utd  Consider hep A/B vaccines with h/o fatty liver  HIV negative  rec healthy diet and exercise given info   Type 2 diabetes mellitus without complication, without long-term current use of insulin (HCC) - Plan: empagliflozin (JARDIANCE) 10 MG TABS tablet, metFORMIN (GLUCOPHAGE) 500 MG tablet, Comprehensive metabolic panel, Lipid panel, CBC with Differential/Platelet, HgB A1c Consider pna 23 vaccine  Foot exam in future  Will need eye exam in future  Saw nutrition and this was helpful also will mail nutrition info as he reports he ordered pizza today for food  Vitamin D deficiency - Plan: Cholecalciferol 1.25 MG (50000 UT) capsule, Vitamin D (25  hydroxy)  Fatty liver - Plan: Hepatitis B surface antibody,quantitative, Hepatitis A Ab, Total  B/l foot pain  -per podiatry consider rheumatology in the future for b/l feet pain and swelling   -we discussed possible serious and likely etiologies, options for evaluation and workup, limitations of telemedicine visit vs in person visit, treatment, treatment risks and precautions. Pt prefers to treat via telemedicine empirically rather then risking or undertaking an in person visit at this moment. Patient agrees to seek prompt in person care if worsening, new symptoms arise, or if is not improving with treatment.   I discussed the assessment and treatment plan with the patient. The patient was provided an opportunity to ask questions and all were answered. The patient agreed with the plan and demonstrated an understanding of the instructions.   The patient was advised to call back or seek an in-person evaluation if the symptoms worsen or if the condition fails to improve as anticipated.  Time 20 minutes  Delorise Jackson, MD

## 2019-09-22 NOTE — Patient Instructions (Signed)
Budget-Friendly Healthy Eating There are many ways to save money at the grocery store and continue to eat healthy. You can be successful if you:  Plan meals according to your budget.  Make a grocery list and only purchase food according to your grocery list.  Prepare food yourself. What are tips for following this plan?  Reading food labels  Compare food labels between brand name foods and the store brand. Often the nutritional value is the same, but the store brand is lower cost.  Look for products that do not have added sugar, fat, or salt (sodium). These often cost the same but are healthier for you. Products may be labeled as: ? Sugar-free. ? Nonfat. ? Low-fat. ? Sodium-free. ? Low-sodium.  Look for lean ground beef labeled as at least 92% lean and 8% fat. Shopping  Buy only the items on your grocery list and go only to the areas of the store that have the items on your list.  Use coupons only for foods and brands you normally buy. Avoid buying items you wouldn't normally buy simply because they are on sale.  Check online and in newspapers for weekly deals.  Buy healthy items from the bulk bins when available, such as herbs, spices, flour, pasta, nuts, and dried fruit.  Buy fruits and vegetables that are in season. Prices are usually lower on in-season produce.  Look at the unit price on the price tag. Use it to compare different brands and sizes to find out which item is the best deal.  Choose healthy items that are often low-cost, such as carrots, potatoes, apples, bananas, and oranges. Dried or canned beans are a low-cost protein source.  Buy in bulk and freeze extra food. Items you can buy in bulk include meats, fish, poultry, frozen fruits, and frozen vegetables.  Avoid buying "ready-to-eat" foods, such as pre-cut fruits and vegetables and pre-made salads.  If possible, shop around to discover where you can find the best prices. Consider other retailers such  as dollar stores, larger [employer], local fruit and vegetable stands, and farmers markets.  Do not shop when you are hungry. If you shop while hungry, it may be hard to stick to your list and budget.  Resist impulse buying. Use your grocery list as your official plan for the week.  Buy a variety of vegetables and fruits by purchasing fresh, frozen, and canned items.  Look at the top and bottom shelves for deals. Foods at eye level (eye level of an adult or child) are usually more expensive.  Be efficient with your time when shopping. The more time you spend at the store, the more money you are likely to spend.  To save money when choosing more expensive foods like meats and dairy: ? Choose cheaper cuts of meat, such as bone-in chicken thighs and drumsticks instead of skinless and boneless chicken. When you are ready to prepare the chicken, you can remove the skin yourself to make it healthier. ? Choose lean meats like chicken or [place] instead of beef. ? Choose canned seafood, such as tuna, salmon, or sardines. ? Buy eggs as a low-cost source of protein. ? Buy dried beans and peas, such as lentils, split peas, or kidney beans instead of meats. Dried beans and peas are a good alternative source of protein. ? Buy the larger tubs of yogurt instead of individual-sized containers.  Choose water instead of sodas and other sweetened beverages.  Avoid buying chips, cookies, and other "junk  food." These items are usually expensive and not healthy. Cooking  Make extra food and freeze the extras in meal-sized containers or in individual portions for fast meals and snacks.  Pre-cook on days when you have extra time to prepare meals in advance. You can keep these meals in the fridge or freezer and reheat for a quick meal.  When you come home from the grocery store, wash, peel, and cut fruits and vegetables so they are ready to use and eat. This will help reduce food waste. Meal planning  Do  not eat out or get fast food. Prepare food at home.  Make a grocery list and make sure to bring it with you to the store. If you have a smart phone, you could use your phone to create your shopping list.  Plan meals and snacks according to a grocery list and budget you create.  Use leftovers in your meal plan for the week.  Look for recipes where you can cook once and make enough food for two meals.  Include budget-friendly meals like stews, casseroles, and stir-fry dishes.  Try some meatless meals or try "no cook" meals like salads.  Make sure that half your plate is filled with fruits or vegetables. Choose from fresh, frozen, or canned fruits and vegetables. If eating canned, remember to rinse them before eating. This will remove any excess salt added for packaging. Summary  Eating healthy on a budget is possible if you plan your meals according to your budget, purchase according to your budget and grocery list, and prepare food yourself.  Tips for buying more food on a limited budget include buying generic brands, using coupons only for foods you normally buy, and buying healthy items from the bulk bins when available.  Tips for buying cheaper food to replace expensive food include choosing cheaper, lean cuts of meat, and buying dried beans and peas. This information is not intended to replace advice given to you by your health care provider. Make sure you discuss any questions you have with your health care provider. Document Released: 06/08/2014 Document Revised: 10/06/2017 Document Reviewed: 10/06/2017 Elsevier Patient Education  2020 [place] Following a healthy eating pattern may help you to achieve and maintain a healthy body weight, reduce the risk of chronic disease, and live a long and productive life. It is important to follow a healthy eating pattern at an appropriate calorie level for your body. Your nutritional needs should be met primarily through  food by choosing a variety of nutrient-rich foods. What are tips for following this plan? Reading food labels  Read labels and choose the following: ? Reduced or low sodium. ? Juices with 100% fruit juice. ? Foods with low saturated fats and high polyunsaturated and monounsaturated fats. ? Foods with whole grains, such as whole wheat, cracked wheat, brown rice, and wild rice. ? Whole grains that are fortified with folic acid. This is recommended for women who are pregnant or who want to become pregnant.  Read labels and avoid the following: ? Foods with a lot of added sugars. These include foods that contain brown sugar, corn sweetener, corn syrup, dextrose, fructose, glucose, high-fructose corn syrup, honey, invert sugar, lactose, malt syrup, maltose, molasses, raw sugar, sucrose, trehalose, or turbinado sugar.  Do not eat more than the following amounts of added sugar per day:  6 teaspoons (25 g) for women.  9 teaspoons (38 g) for men. ? Foods that contain processed or refined starches and grains. ?  Refined grain products, such as white flour, degermed cornmeal, white bread, and white rice. Shopping  Choose nutrient-rich snacks, such as vegetables, whole fruits, and nuts. Avoid high-calorie and high-sugar snacks, such as potato chips, fruit snacks, and candy.  Use oil-based dressings and spreads on foods instead of solid fats such as butter, stick margarine, or cream cheese.  Limit pre-made sauces, mixes, and "instant" products such as flavored rice, instant noodles, and ready-made pasta.  Try more plant-protein sources, such as tofu, tempeh, black beans, edamame, lentils, nuts, and seeds.  Explore eating plans such as the Mediterranean diet or vegetarian diet. Cooking  Use oil to saut or stir-fry foods instead of solid fats such as butter, stick margarine, or lard.  Try baking, boiling, grilling, or broiling instead of frying.  Remove the fatty part of meats before  cooking.  Steam vegetables in water or broth. Meal planning   At meals, imagine dividing your plate into fourths: ? One-half of your plate is fruits and vegetables. ? One-fourth of your plate is whole grains. ? One-fourth of your plate is protein, especially lean meats, poultry, eggs, tofu, beans, or nuts.  Include low-fat dairy as part of your daily diet. Lifestyle  Choose healthy options in all settings, including home, work, school, restaurants, or stores.  Prepare your food safely: ? Wash your hands after handling raw meats. ? Keep food preparation surfaces clean by regularly washing with hot, soapy water. ? Keep raw meats separate from ready-to-eat foods, such as fruits and vegetables. ? Cook seafood, meat, poultry, and eggs to the recommended internal temperature. ? Store foods at safe temperatures. In general:  Keep cold foods at 58F (4.4C) or below.  Keep hot foods at 158F (60C) or above.  Keep your freezer at [facility] (-17.8C) or below.  Foods are no longer safe to eat when they have been between the temperatures of 40-158F (4.4-60C) for more than 2 hours. What foods should I eat? Fruits Aim to eat 2 cup-equivalents of fresh, canned (in natural juice), or frozen fruits each day. Examples of 1 cup-equivalent of fruit include 1 small apple, 8 large strawberries, 1 cup canned fruit,  cup dried fruit, or 1 cup 100% juice. Vegetables Aim to eat 2-3 cup-equivalents of fresh and frozen vegetables each day, including different varieties and colors. Examples of 1 cup-equivalent of vegetables include 2 medium carrots, 2 cups raw, leafy greens, 1 cup chopped vegetable (raw or cooked), or 1 medium baked potato. Grains Aim to eat 6 ounce-equivalents of whole grains each day. Examples of 1 ounce-equivalent of grains include 1 slice of bread, 1 cup ready-to-eat cereal, 3 cups popcorn, or  cup cooked rice, pasta, or cereal. Meats and other proteins Aim to eat 5-6  ounce-equivalents of protein each day. Examples of 1 ounce-equivalent of protein include 1 egg, 1/2 cup nuts or seeds, or 1 tablespoon (16 g) peanut butter. A cut of meat or fish that is the size of a deck of cards is about 3-4 ounce-equivalents.  Of the protein you eat each week, try to have at least 8 ounces come from seafood. This includes salmon, trout, herring, and anchovies. Dairy Aim to eat 3 cup-equivalents of fat-free or low-fat dairy each day. Examples of 1 cup-equivalent of dairy include 1 cup (240 mL) milk, 8 ounces (250 g) yogurt, 1 ounces (44 g) natural cheese, or 1 cup (240 mL) fortified soy milk. Fats and oils  Aim for about 5 teaspoons (21 g) per day. Choose monounsaturated fats, such  as canola and olive oils, avocados, peanut butter, and most nuts, or polyunsaturated fats, such as sunflower, corn, and soybean oils, walnuts, pine nuts, sesame seeds, sunflower seeds, and flaxseed. Beverages  Aim for six 8-oz glasses of water per day. Limit coffee to three to five 8-oz cups per day.  Limit caffeinated beverages that have added calories, such as soda and energy drinks.  Limit alcohol intake to no more than 1 drink a day for nonpregnant women and 2 drinks a day for men. One drink equals 12 oz of beer (355 mL), 5 oz of wine (148 mL), or 1 oz of hard liquor (44 mL). Seasoning and other foods  Avoid adding excess amounts of salt to your foods. Try flavoring foods with herbs and spices instead of salt.  Avoid adding sugar to foods.  Try using oil-based dressings, sauces, and spreads instead of solid fats. This information is based on general [place] nutrition guidelines. For more information, visit Build[employer].es. Exact amounts may vary based on your nutrition needs. Summary  A healthy eating plan may help you to maintain a healthy weight, reduce the risk of chronic diseases, and stay active throughout your life.  Plan your meals. Make sure you eat the right portions of a  variety of nutrient-rich foods.  Try baking, boiling, grilling, or broiling instead of frying.  Choose healthy options in all settings, including home, work, school, restaurants, or stores. This information is not intended to replace advice given to you by your health care provider. Make sure you discuss any questions you have with your health care provider. Document Released: 01/17/2018 Document Revised: 01/17/2018 Document Reviewed: 01/17/2018 Elsevier Patient Education  [place].   Diabetes Mellitus and Nutrition, Adult When you have diabetes (diabetes mellitus), it is very important to have healthy eating habits because your blood sugar (glucose) levels are greatly affected by what you eat and drink. Eating healthy foods in the appropriate amounts, at about the same times every day, can help you:  Control your blood glucose.  Lower your risk of heart disease.  Improve your blood pressure.  Reach or maintain a healthy weight. Every person with diabetes is different, and each person has different needs for a meal plan. Your health care provider may recommend that you work with a diet and nutrition specialist (dietitian) to make a meal plan that is best for you. Your meal plan may vary depending on factors such as:  The calories you need.  The medicines you take.  Your weight.  Your blood glucose, blood pressure, and cholesterol levels.  Your activity level.  Other health conditions you have, such as heart or kidney disease. How do carbohydrates affect me? Carbohydrates, also called carbs, affect your blood glucose level more than any other type of food. Eating carbs naturally raises the amount of glucose in your blood. Carb counting is a method for keeping track of how many carbs you eat. Counting carbs is important to keep your blood glucose at a healthy level, especially if you use insulin or take certain oral diabetes medicines. It is important to know how many carbs  you can safely have in each meal. This is different for every person. Your dietitian can help you calculate how many carbs you should have at each meal and for each snack. Foods that contain carbs include:  Bread, cereal, rice, pasta, and crackers.  Potatoes and corn.  Peas, beans, and lentils.  Milk and yogurt.  Fruit and juice.  Desserts, such as  cakes, cookies, ice cream, and candy. How does alcohol affect me? Alcohol can cause a sudden decrease in blood glucose (hypoglycemia), especially if you use insulin or take certain oral diabetes medicines. Hypoglycemia can be a life-threatening condition. Symptoms of hypoglycemia (sleepiness, dizziness, and confusion) are similar to symptoms of having too much alcohol. If your health care provider says that alcohol is safe for you, follow these guidelines:  Limit alcohol intake to no more than 1 drink per day for nonpregnant women and 2 drinks per day for men. One drink equals 12 oz of beer, 5 oz of wine, or 1 oz of hard liquor.  Do not drink on an empty stomach.  Keep yourself hydrated with water, diet soda, or unsweetened iced tea.  Keep in mind that regular soda, juice, and other mixers may contain a lot of sugar and must be counted as carbs. What are tips for following this plan?  Reading food labels  Start by checking the serving size on the "Nutrition Facts" label of packaged foods and drinks. The amount of calories, carbs, fats, and other nutrients listed on the label is based on one serving of the item. Many items contain more than one serving per package.  Check the total grams (g) of carbs in one serving. You can calculate the number of servings of carbs in one serving by dividing the total carbs by 15. For example, if a food has 30 g of total carbs, it would be equal to 2 servings of carbs.  Check the number of grams (g) of saturated and trans fats in one serving. Choose foods that have low or no amount of these fats.  Check  the number of milligrams (mg) of salt (sodium) in one serving. Most people should limit total sodium intake to less than 2,300 mg per day.  Always check the nutrition information of foods labeled as "low-fat" or "nonfat". These foods may be higher in added sugar or refined carbs and should be avoided.  Talk to your dietitian to identify your daily goals for nutrients listed on the label. Shopping  Avoid buying canned, premade, or processed foods. These foods tend to be high in fat, sodium, and added sugar.  Shop around the outside edge of the grocery store. This includes fresh fruits and vegetables, bulk grains, fresh meats, and fresh dairy. Cooking  Use low-heat cooking methods, such as baking, instead of high-heat cooking methods like deep frying.  Cook using healthy oils, such as olive, canola, or sunflower oil.  Avoid cooking with butter, cream, or high-fat meats. Meal planning  Eat meals and snacks regularly, preferably at the same times every day. Avoid going long periods of time without eating.  Eat foods high in fiber, such as fresh fruits, vegetables, beans, and whole grains. Talk to your dietitian about how many servings of carbs you can eat at each meal.  Eat 4-6 ounces (oz) of lean protein each day, such as lean meat, chicken, fish, eggs, or tofu. One oz of lean protein is equal to: ? 1 oz of meat, chicken, or fish. ? 1 egg. ?  cup of tofu.  Eat some foods each day that contain healthy fats, such as avocado, nuts, seeds, and fish. Lifestyle  Check your blood glucose regularly.  Exercise regularly as told by your health care provider. This may include: ? 150 minutes of moderate-intensity or vigorous-intensity exercise each week. This could be brisk walking, biking, or water aerobics. ? Stretching and doing strength exercises, such as  yoga or weightlifting, at least 2 times a week.  Take medicines as told by your health care provider.  Do not use any products that  contain nicotine or tobacco, such as cigarettes and e-cigarettes. If you need help quitting, ask your health care provider.  Work with a Social worker or diabetes educator to identify strategies to manage stress and any emotional and social challenges. Questions to ask a health care provider  Do I need to meet with a diabetes educator?  Do I need to meet with a dietitian?  What number can I call if I have questions?  When are the best times to check my blood glucose? Where to find more information:  American Diabetes Association: diabetes.org  Academy of Nutrition and Dietetics: www.eatright.[employer] of Diabetes and Digestive and Kidney Diseases (NIH): [employer] Summary  A healthy meal plan will help you control your blood glucose and maintain a healthy lifestyle.  Working with a diet and nutrition specialist (dietitian) can help you make a meal plan that is best for you.  Keep in mind that carbohydrates (carbs) and alcohol have immediate effects on your blood glucose levels. It is important to count carbs and to use alcohol carefully. This information is not intended to replace advice given to you by your health care provider. Make sure you discuss any questions you have with your health care provider. Document Released: 07/02/2005 Document Revised: 09/17/2017 Document Reviewed: 11/09/2016 Elsevier Patient Education  2020 [place].  Nonalcoholic Fatty Liver Disease Diet, Adult Nonalcoholic fatty liver disease is a condition that causes fat to build up in and around the liver. The disease makes it harder for the liver to work the way that it should. Following a healthy diet can help to keep nonalcoholic fatty liver disease under control. It can also help to prevent or improve conditions that are associated with the disease, such as heart disease, diabetes, high blood pressure, and abnormal cholesterol levels. Along with regular exercise, this diet:  Promotes  weight loss.  Helps to control blood sugar levels.  Helps to improve the way that the body uses insulin. What are tips for following this plan? Reading food labels Always check food labels for:  The amount of saturated fat in a food. You should limit your intake of saturated fat. Saturated fat is found in foods that come from animals, including meat and dairy products such as butter, cheese, and whole milk.  The amount of fiber in a food. You should choose high-fiber foods such as fruits, vegetables, and whole grains. Try to get 25-30 grams (g) of fiber a day.  Cooking  When cooking, use heart-healthy oils that are high in monounsaturated fats. These include olive oil, canola oil, and avocado oil.  Limit frying or deep-frying foods. Cook foods using healthy methods such as baking, boiling, steaming, and grilling instead. Meal planning  You may want to keep track of how many calories you take in. Eating the right amount of calories will help you achieve a healthy weight. Meeting with a registered dietitian can help you get started.  Limit how often you eat takeout and fast food. These foods are usually very high in fat, salt, and sugar.  Use the glycemic index (GI) to plan your meals. The index tells you how quickly a food will raise your blood sugar. Choose low-GI foods (GI less than 55). These foods take a longer time to raise blood sugar. A registered dietitian can help you identify foods lower on  the GI scale. Lifestyle  You may want to follow a Mediterranean diet. This diet includes a lot of vegetables, lean meats or fish, whole grains, fruits, and healthy oils and fats. What foods can I eat?  Fruits Bananas. Apples. Oranges. Grapes. Papaya. Mango. Pomegranate. Kiwi. Grapefruit. Cherries. Vegetables Lettuce. Spinach. Peas. Beets. Cauliflower. Cabbage. Broccoli. Carrots. Tomatoes. Squash. Eggplant. Herbs. Peppers. Onions. Cucumbers. Brussels sprouts. Yams and sweet potatoes.  Beans. Lentils. Grains Whole wheat or whole-grain foods, including breads, crackers, cereals, and pasta. Stone-ground whole wheat. Unsweetened oatmeal. Bulgur. Barley. Quinoa. Brown or wild rice. Corn or whole wheat flour tortillas. Meats and other proteins Lean meats. Poultry. Tofu. Seafood and shellfish. Dairy Low-fat or fat-free dairy products, such as yogurt, cottage cheese, or cheese. Beverages Water. Sugar-free drinks. Tea. Coffee. Low-fat or skim milk. Milk alternatives, such as soy or almond milk. Real fruit juice. Fats and oils Avocado. Canola or olive oil. Nuts and nut butters. Seeds. Seasonings and condiments Mustard. Relish. Low-fat, low-sugar ketchup and barbecue sauce. Low-fat or fat-free mayonnaise. Sweets and desserts Sugar-free sweets. The items listed above may not be a complete list of foods and beverages you can eat. Contact a dietitian for more information. What foods should I limit or avoid? Meats and other proteins Limit red meat to 1-2 times a week. Dairy [employer]. Fats and oils Palm oil and coconut oil. Fried foods. Other foods Processed foods. Foods that contain a lot of salt or sodium. Sweets and desserts Sweets that contain sugar. Beverages Sweetened drinks, such as sweet tea, milkshakes, iced sweet drinks, and sodas. Alcohol. The items listed above may not be a complete list of foods and beverages you should avoid. Contact a dietitian for more information. Where to find more information The [employer] of Diabetes and Digestive and Kidney Diseases: [employer] Summary  Nonalcoholic fatty liver disease is a condition that causes fat to build up in and around the liver.  Following a healthy diet can help to keep nonalcoholic fatty liver disease under control. Your diet should be rich in fruits, vegetables, whole grains, and lean proteins.  Limit your intake of saturated fat. Saturated fat is found in foods that come from animals,  including meat and dairy products such as butter, cheese, and whole milk.  This diet promotes weight loss, helps to control blood sugar levels, and helps to improve the way that the body uses insulin. This information is not intended to replace advice given to you by your health care provider. Make sure you discuss any questions you have with your health care provider. Document Released: 02/19/2015 Document Revised: 01/27/2019 Document Reviewed: 10/27/2018 Elsevier Patient Education  2020 [place]. Fatty Liver Disease  Fatty liver disease occurs when too much fat has built up in your liver cells. Fatty liver disease is also called hepatic steatosis or steatohepatitis. The liver removes harmful substances from your bloodstream and produces fluids that your body needs. It also helps your body use and store energy from the food you eat. In many cases, fatty liver disease does not cause symptoms or problems. It is often diagnosed when tests are being done for other reasons. However, over time, fatty liver can cause inflammation that may lead to more serious liver problems, such as scarring of the liver (cirrhosis) and liver failure. Fatty liver is associated with insulin resistance, increased body fat, high blood pressure (hypertension), and high cholesterol. These are features of metabolic syndrome and increase your risk for stroke, diabetes, and heart disease. What are the  causes? This condition may be caused by:  Drinking too much alcohol.  Poor nutrition.  Obesity.  Cushing's syndrome.  Diabetes.  High cholesterol.  Certain drugs.  Poisons.  Some viral infections.  Pregnancy. What increases the risk? You are more likely to develop this condition if you:  Abuse alcohol.  Are overweight.  Have diabetes.  Have hepatitis.  Have a high triglyceride level.  Are pregnant. What are the signs or symptoms? Fatty liver disease often does not cause symptoms. If symptoms do  develop, they can include:  Fatigue.  Weakness.  Weight loss.  Confusion.  Abdominal pain.  Nausea and vomiting.  Yellowing of your skin and the white parts of your eyes (jaundice).  Itchy skin. How is this diagnosed? This condition may be diagnosed by:  A physical exam and medical history.  Blood tests.  Imaging tests, such as an ultrasound, CT scan, or MRI.  A liver biopsy. A small sample of liver tissue is removed using a needle. The sample is then looked at under a microscope. How is this treated? Fatty liver disease is often caused by other health conditions. Treatment for fatty liver may involve medicines and lifestyle changes to manage conditions such as:  Alcoholism.  High cholesterol.  Diabetes.  Being overweight or obese. Follow these instructions at home:   Do not drink alcohol. If you have trouble quitting, ask your health care provider how to safely quit with the help of medicine or a supervised program. This is important to keep your condition from getting worse.  Eat a healthy diet as told by your health care provider. Ask your health care provider about working with a diet and nutrition specialist (dietitian) to develop an eating plan.  Exercise regularly. This can help you lose weight and control your cholesterol and diabetes. Talk to your health care provider about an exercise plan and which activities are best for you.  Take over-the-counter and prescription medicines only as told by your health care provider.  Keep all follow-up visits as told by your health care provider. This is important. Contact a health care provider if: You have trouble controlling your:  Blood sugar. This is especially important if you have diabetes.  Cholesterol.  Drinking of alcohol. Get help right away if:  You have abdominal pain.  You have jaundice.  You have nausea and vomiting.  You vomit blood or material that looks like coffee grounds.  You have  stools that are black, tar-like, or bloody. Summary  Fatty liver disease develops when too much fat builds up in the cells of your liver.  Fatty liver disease often causes no symptoms or problems. However, over time, fatty liver can cause inflammation that may lead to more serious liver problems, such as scarring of the liver (cirrhosis).  You are more likely to develop this condition if you abuse alcohol, are pregnant, are overweight, have diabetes, have hepatitis, or have high triglyceride levels.  Contact your health care provider if you have trouble controlling your weight, blood sugar, cholesterol, or drinking of alcohol. This information is not intended to replace advice given to you by your health care provider. Make sure you discuss any questions you have with your health care provider. Document Released: 11/20/2005 Document Revised: 09/17/2017 Document Reviewed: 07/14/2017 Elsevier Patient Education  2020 [employer].

## 2019-09-25 ENCOUNTER — Telehealth: Payer: Self-pay

## 2019-09-25 NOTE — Telephone Encounter (Signed)
Sch fasting labs asap  Sch flu shot same day  Sch weight same day   Asap  Thanks  [place]

## 2019-09-25 NOTE — Telephone Encounter (Signed)
-----   Message from Delorise Jackson, MD sent at 09/22/2019  5:51 PM EST ----- Sch fasting labs asap Sch flu shot same day Sch weight same day Asap ThanksTMS

## 2019-09-27 ENCOUNTER — Other Ambulatory Visit: Payer: Commercial Managed Care - PPO

## 2019-09-29 ENCOUNTER — Other Ambulatory Visit: Payer: Self-pay

## 2019-09-29 ENCOUNTER — Ambulatory Visit: Payer: Commercial Managed Care - PPO | Admitting: *Deleted

## 2019-09-29 ENCOUNTER — Other Ambulatory Visit (INDEPENDENT_AMBULATORY_CARE_PROVIDER_SITE_OTHER): Payer: Commercial Managed Care - PPO

## 2019-09-29 ENCOUNTER — Encounter: Payer: Self-pay | Admitting: *Deleted

## 2019-09-29 VITALS — Ht 65.0 in | Wt 175.2 lb

## 2019-09-29 DIAGNOSIS — E119 Type 2 diabetes mellitus without complications: Secondary | ICD-10-CM

## 2019-09-29 DIAGNOSIS — Z13818 Encounter for screening for other digestive system disorders: Secondary | ICD-10-CM

## 2019-09-29 DIAGNOSIS — E559 Vitamin D deficiency, unspecified: Secondary | ICD-10-CM | POA: Diagnosis not present

## 2019-09-29 DIAGNOSIS — K760 Fatty (change of) liver, not elsewhere classified: Secondary | ICD-10-CM

## 2019-09-29 DIAGNOSIS — Z1159 Encounter for screening for other viral diseases: Secondary | ICD-10-CM

## 2019-09-29 LAB — CBC WITH DIFFERENTIAL/PLATELET
Basophils Absolute: 0.1 10*3/uL (ref 0.0–0.1)
Basophils Relative: 1 % (ref 0.0–3.0)
Eosinophils Absolute: 0.1 10*3/uL (ref 0.0–0.7)
Eosinophils Relative: 1.6 % (ref 0.0–5.0)
HCT: 41.9 % (ref 39.0–52.0)
Hemoglobin: 13.8 g/dL (ref 13.0–17.0)
Lymphocytes Relative: 17.5 % (ref 12.0–46.0)
Lymphs Abs: 1.5 10*3/uL (ref 0.7–4.0)
MCHC: 32.9 g/dL (ref 30.0–36.0)
MCV: 85.7 fl (ref 78.0–100.0)
Monocytes Absolute: 0.6 10*3/uL (ref 0.1–1.0)
Monocytes Relative: 7.4 % (ref 3.0–12.0)
Neutro Abs: 6.2 10*3/uL (ref 1.4–7.7)
Neutrophils Relative %: 72.5 % (ref 43.0–77.0)
Platelets: 285 10*3/uL (ref 150.0–400.0)
RBC: 4.88 Mil/uL (ref 4.22–5.81)
RDW: 14 % (ref 11.5–15.5)
WBC: 8.6 10*3/uL (ref 4.0–10.5)

## 2019-09-29 LAB — LIPID PANEL
Cholesterol: 186 mg/dL (ref 0–200)
HDL: 34.1 mg/dL — ABNORMAL LOW (ref >39.00)
LDL Cholesterol: 124 mg/dL — ABNORMAL HIGH (ref 0–99)
NonHDL: 151.98
Total CHOL/HDL Ratio: 5
Triglycerides: 139 mg/dL (ref 0.0–149.0)
VLDL: 27.8 mg/dL (ref 0.0–40.0)

## 2019-09-29 LAB — COMPREHENSIVE METABOLIC PANEL
ALT: 42 U/L (ref 0–53)
AST: 28 U/L (ref 0–37)
Albumin: 4.3 g/dL (ref 3.5–5.2)
Alkaline Phosphatase: 103 U/L (ref 39–117)
BUN: 15 mg/dL (ref 6–23)
CO2: 29 mEq/L (ref 19–32)
Calcium: 9.5 mg/dL (ref 8.4–10.5)
Chloride: 100 mEq/L (ref 96–112)
Creatinine, Ser: 0.82 mg/dL (ref 0.40–1.50)
GFR: 105.83 mL/min (ref >60.00)
Glucose, Bld: 132 mg/dL — ABNORMAL HIGH (ref 70–99)
Potassium: 4 mEq/L (ref 3.5–5.1)
Sodium: 137 mEq/L (ref 135–145)
Total Bilirubin: 0.8 mg/dL (ref 0.2–1.2)
Total Protein: 7 g/dL (ref 6.0–8.3)

## 2019-09-29 LAB — HEMOGLOBIN A1C: Hgb A1c MFr Bld: 6.8 % — ABNORMAL HIGH (ref 4.6–6.5)

## 2019-09-29 LAB — VITAMIN D 25 HYDROXY (VIT D DEFICIENCY, FRACTURES): VITD: 29.19 ng/mL — ABNORMAL LOW (ref 30.00–100.00)

## 2019-09-29 NOTE — Progress Notes (Signed)
Patient came into office for weight check declined flu.175.2LB height 5\' 5"  See phone note.

## 2019-10-02 LAB — HEPATITIS B SURFACE ANTIBODY, QUANTITATIVE: Hep B S AB Quant (Post): <5 m[IU]/mL — ABNORMAL LOW (ref >=10)

## 2019-10-02 LAB — HEPATITIS A ANTIBODY, TOTAL: Hepatitis A AB,Total: REACTIVE — AB

## 2019-11-21 ENCOUNTER — Ambulatory Visit (INDEPENDENT_AMBULATORY_CARE_PROVIDER_SITE_OTHER): Payer: Commercial Managed Care - PPO | Admitting: Internal Medicine

## 2019-11-21 ENCOUNTER — Encounter: Payer: Self-pay | Admitting: Internal Medicine

## 2019-11-21 VITALS — Ht 65.0 in | Wt 170.0 lb

## 2019-11-21 DIAGNOSIS — L304 Erythema intertrigo: Secondary | ICD-10-CM | POA: Diagnosis not present

## 2019-11-21 DIAGNOSIS — B356 Tinea cruris: Secondary | ICD-10-CM | POA: Diagnosis not present

## 2019-11-21 DIAGNOSIS — E119 Type 2 diabetes mellitus without complications: Secondary | ICD-10-CM | POA: Diagnosis not present

## 2019-11-21 MED ORDER — MICONAZOLE NITRATE 2 % EX CREA: 1.0000 "application " | TOPICAL_CREAM | Freq: Two times a day (BID) | CUTANEOUS | 11 refills | Status: DC

## 2019-11-21 MED ORDER — FLUCONAZOLE 150 MG PO TABS: 150.0000 mg | ORAL_TABLET | ORAL | 0 refills | Status: DC

## 2019-11-21 MED ORDER — GOLD BOND NO MESS BODY POWDER EX AERP: 1.0000 "application " | INHALATION_SPRAY | Freq: Two times a day (BID) | CUTANEOUS | 11 refills | Status: DC

## 2019-11-21 NOTE — Patient Instructions (Addendum)
Gold bond talc free powder daily to keep you dry in the creases   COVID-19 Vaccine Information can be found at: [email or web address] For questions related to vaccine distribution or appointments, please email [email or web address] or call [phone].   Tia inguinal Jock Itch La tia inguinal (tinea cruris) es una infeccin de la piel en la zona de la ingle. Es causada por un hongo, que es un tipo de germen que vive en lugares oscuros y [employer]. La tia inguinal causa una erupcin cutnea que produce picazn en la ingle y la parte superior de los muslos. Por lo general, desaparece en 2 a 3 semanas de tratamiento. Cules son las causas? El hongo que causa la tia inguinal se puede transmitir:  Al tocar una infeccin por hongos en otra parte del cuerpo, como pie de atleta, y luego tocar la zona de la ingle.  Al compartir toallas o ropa, como medias o zapatos, con alguien que tenga una infeccin por hongos. Qu incrementa el riesgo? La tia inguinal es ms frecuente en los hombres y los varones adolescentes. Tambin es ms probable que desarrolle la afeccin si:  Est en un clima caluroso y hmedo.  [place] ropa ajustada o trajes de bao mojados durante largos perodos de [place].  Practica deportes.  Tiene sobrepeso.  Tiene diabetes.  Tiene el sistema inmunitario debilitado.  Santo Held. Cules son los signos o los sntomas? Los sntomas de la tia inguinal pueden incluir:  Erupcin cutnea roja, rosada o marrn en la zona de la ingle. Puede haber ampollas. La erupcin cutnea se puede propagar [employer] , el ano, y las [place].  Piel seca y escamosa dentro o alrededor de la erupcin.  Picazn. Cmo se diagnostica? En la [employer], el mdico puede hacer el diagnstico observando la erupcin cutnea. En algunos casos, se toma una [place] de piel infectada mediante raspado. Arbutus Ped se puede examinar con  un microscopio (biopsia) o intentando desarrollar el hongo a partir de la [place] (cultivo). Cmo se trata? El tratamiento de esta afeccin puede incluir lo siguiente:  Medicamentos antimicticos para Architect. Estos pueden ser Ardelia Mems crema, ungento o polvo para la piel, o puede ser un medicamento que se toma por boca.  Crema o ungento para la piel para reducir Cabin crew.  Cambios en el estilo de vida, como el uso de ropa ms holgada y el cuidado de la piel. Siga estas indicaciones en su casa: Cuidado de la piel  Aplquese cremas, ungentos o polvos para la piel exactamente como se lo haya indicado el mdico.  Use ropa holgada que no se roce contra la zona de la ingle. Los hombres deben usar calzoncillos o ropa interior [place].  Mantenga la zona de la ingle limpia y seca. ? [employer] ropa interior [employer]. ? Cmbiese los trajes de bao mojados lo ms pronto posible. ? Despus del bao, use una toalla aparte para secar la zona de la ingle con suavidad y en su totalidad. Usar un toalla aparte ayudar a prevenir la propagacin de la infeccin a otras zonas del cuerpo.  Evite baos y duchas calientes. El agua caliente puede empeorar la picazn.  No se rasque la zona afectada. Instrucciones generales  [place] y [employer] medicamentos de venta libre y los recetados solamente como se lo haya indicado el mdico.  No comparta toallas, ropa ni elementos personales con Producer, television/film/video.  Lvese las manos con agua y jabn con frecuencia, especialmente despus de tocarse la zona de la  ingle. Use desinfectante para manos con alcohol si no dispone de [place] y [place]. Comunquese con un mdico si:  La erupcin cutnea: ? Empeora o no mejora luego de 2 semanas de tratamiento. ? Se propaga. ? Se vuelve a manifestar una vez finalizado el tratamiento.  Tiene alguno de los siguientes sntomas: ? Cristy Hilts. ? Enrojecimiento, hinchazn o dolor alrededor de la erupcin cutnea, nuevos o  empeoramiento de los ya existentes. ? Lquido, sangre o pus que salen de la erupcin cutnea. Resumen  La tia inguinal (tinea cruris) es una infeccin por hongos de la piel en la zona de la ingle.  El hongo se puede propagar al compartir ropa o al tocar una infeccin por hongos en otra parte del cuerpo y luego tocar la zona de la ingle.  El tratamiento puede incluir medicamentos antifngicos y cambios en el estilo de vida, como mantener la zona limpia y [place]. Esta informacin no tiene Marine scientist el consejo del mdico. Asegrese de hacerle al mdico cualquier pregunta que tenga. Document Revised: 11/28/2017 Document Reviewed: 11/28/2017 Elsevier Patient Education  [place] Itch Jock itch (tinea cruris) is an infection of the skin in the groin area. It is caused by a fungus, which is a type of germ that lives in dark, damp places. Jock itch causes an itchy rash in the groin and upper thigh area. It usually goes away in 2-3 weeks with treatment. What are the causes? The fungus that causes jock itch may be spread by:  Touching a fungus infection elsewhere on your body, such as athlete's foot, and then touching your groin area.  Sharing towels or clothing, such as socks or shoes, with someone who has a fungal infection. What increases the risk? Jock itch is most common in men and adolescent boys. You are also more likely to develop the condition if you:  Are in a hot, humid climate.  Wear tight-fitting clothing or wet bathing suits for long periods of time.  Play sports.  Are overweight.  Have diabetes.  Have a weakened immune system.  Sweat a lot. What are the signs or symptoms? Symptoms of jock itch may include:  A red, pink, or brown rash in the groin area. Blisters may be present. The rash may spread to the thighs, anus, and buttocks.  Dry and scaly skin on or around the rash.  Itchiness. How is this diagnosed? In most cases, your health care  provider can make the diagnosis by looking at your rash. In some cases, a sample of infected skin may be scraped off. This sample may be examined under a microscope (biopsy) or by trying to grow the fungus from the sample (culture). How is this treated? Treatment for this condition may include:  Antifungal medicine to kill the fungus. This may be a skin cream, ointment, or powder, or it may be a medicine that you take by mouth.  Skin cream or ointment to reduce itching.  Lifestyle changes, such as wearing looser clothing and caring for your skin. Follow these instructions at home: Skin care  Apply skin creams, ointments, or powders exactly as told by your health care provider.  Wear loose-fitting clothing that does not rub against your groin area. Men should wear boxer shorts or loose-fitting underwear.  Keep your groin area clean and dry. ? Change your underwear every day. ? Change out of wet bathing suits as soon as possible. ? After bathing, use a separate towel to dry your groin area thoroughly  and gently. Using a separate towel will help prevent spreading the infection to other areas of your body.  Avoid hot baths and showers. Hot water can make itching worse.  Do not scratch the affected area. General instructions  Take and apply over-the-counter and prescription medicines only as told by your health care provider.  Do not share towels, clothing, or personal items with other people.  Wash your hands often with soap and water, especially after touching your groin area. If soap and water are not available, use alcohol-based hand sanitizer. Contact a health care provider if:  Your rash: ? Gets worse or does not get better after 2 weeks of treatment. ? Spreads. ? Returns after treatment is finished.  You have any of the following: ? A fever. ? New or worsening redness, swelling, or pain around your rash. ? Fluid, blood, or pus coming from your rash. Summary  Jock itch  (tinea cruris) is a fungal infection of the skin in the groin area.  The fungus can be spread by sharing clothing or by touching a fungus infection elsewhere on your body and then touching your groin area.  Treatment may include antifungal medicine and lifestyle changes, such as keeping the area clean and dry. This information is not intended to replace advice given to you by your health care provider. Make sure you discuss any questions you have with your health care provider. Document Revised: 09/17/2017 Document Reviewed: 09/15/2017 Elsevier Patient Education  [place] El intertrigo es una irritacin o inflamacin de la piel (dermatitis) que ocurre cuando los pliegues de la piel se rozan entre s. La irritacin puede causar una erupcin cutnea, y que la piel quede en carne viva y con picazn. Esta afeccin es ms frecuente en los pliegues de la piel de esta reas:  Los dedos de los pies.  Las [place]ial[place].  La ingle.  Debajo del vientre.  Bootjack mamas.  Las nalgas. El intertrigo no se transmite de [place] persona a otra (no es contagioso). Cules son las causas? Esta afeccin es causada por el calor, la humedad, el roce (friccin) y la falta de una buena circulacin de aire. Esta afeccin puede empeorar debido a lo siguiente:  Transpiracin.  Bacterias.  Un hongo, como las levaduras. Qu incrementa el riesgo? Esta afeccin es ms probable que ocurra si tiene [employer] de la piel. Es ms probable que tenga esta afeccin si:  Tiene diabetes.  Tiene sobrepeso.  No puede moverse o no realiza [place].  Vive en un rea de clima clido y hmedo.  [place] frulas, dispositivos ortopdicos u otros dispositivos mdicos.  No puede controlar los intestinos o la vejiga (tiene incontinencia). Cules son los signos o los sntomas? Los sntomas de esta afeccin incluyen los siguientes:  Una erupcin cutnea rosa o roja en el pliegue  de la piel o cerca del pliegue de la piel.  Piel en [place].  Picazn.  Sensacin de ardor.  Sangrado.  Secrecin de lquido.  Mal olor. Cmo se diagnostica? Esta afeccin se diagnostica mediante una revisin de los antecedentes mdicos y un examen fsico. Es posible que le indiquen un hisopado cutneo para detectar bacterias u hongos. Cmo se trata? El tratamiento para esta afeccin puede incluir lo siguiente:  Theatre manager la piel limpia y Audiological scientist.  Tomar un antibitico o usar una crema antibitica en la piel para combatir una infeccin bacteriana.  Usar una crema antimictica en la piel o tomar  pldoras para tratar una infeccin causada por hongos, como levaduras.  Usar una pomada con corticoesteroides para Human resources officer picazn y Conservation officer, nature.  Separar el pliegue de la piel con un pao de algodn limpio para que absorba la humedad y permita que circule aire por la zona. Siga estas indicaciones en su casa:  Mantenga la zona afectada limpia y seca.  No se rasque la piel.  Permanezca en un ambiente fresco tanto como sea posible. Si es factible, use el aire acondicionado o Programmer, systems.  Aplquese los medicamentos de venta libre y los recetados solamente como se lo haya indicado el mdico.  Si le recetaron un antibitico, tmelo como se lo haya indicado el mdico. No deje de usar el antibitico aunque la afeccin mejore.  Concurra a todas las visitas de seguimiento como se lo haya indicado el mdico. Esto es importante. Cmo se evita?   Mantenga un peso saludable.  Cuide sus pies, especialmente si es diabtico. El cuidado de los pies incluye los siguiente: ? Usar zapatos que calcen bien. ? Public affairs consultant. ? Usar calcetines limpios y transpirables.  Proteja la piel alrededor de la ingle y las nalgas, especialmente si tiene incontinencia. La proteccin de la piel incluye lo siguiente: ? Seguir una rutina de limpieza regular. ? Usar cremas, polvos o  pomadas que protejan la piel. ? Cambiar los apsitos protectores con frecuencia.  No use ropa ajustada. Use ropa suelta, absorbente y hecha de algodn.  Use un sostn que proporcione buen soporte, si es necesario.  Dchese y squese bien despus de realizar una actividad o de hacer ejercicio. Use un secador con aire fro para secar la [employer] pliegues, especialmente despus de baarse.  Si tiene diabetes, mantenga bajo control el nivel de Dispensing optician. Comunquese con un mdico si:  Los sntomas no mejoran con Dispensing optician.  Los sntomas empeoran o se extienden a Airline pilot del cuerpo.  Nota ms enrojecimiento y calor.  Tiene fiebre. Resumen  El intertrigo es una irritacin o inflamacin de la piel (dermatitis) que ocurre cuando los pliegues de la piel se rozan entre s.  Esta afeccin es causada por el calor, la humedad, el roce (friccin) y la falta de una buena circulacin de aire.  Esta afeccin puede tratarse limpiando y secando la piel y [place].  Aplquese los medicamentos de venta libre y los recetados solamente como se lo haya indicado el mdico.  Consulting civil engineer a todas las visitas de seguimiento como se lo haya indicado el mdico. Esto es importante. Esta informacin no tiene Marine scientist el consejo del mdico. Asegrese de hacerle al mdico cualquier pregunta que tenga. Document Revised: 04/14/2018 Document Reviewed: 04/14/2018 Elsevier Patient Education  2020 [place] is skin irritation or inflammation (dermatitis) that occurs when folds of skin rub together. The irritation can cause a rash and make skin raw and itchy. This condition most commonly occurs in the skin folds of these areas:  Toes.  Armpits.  Groin.  Under the belly.  Under the breasts.  Buttocks. Intertrigo is not passed from person to person (is not contagious). What are the causes? This condition is caused by heat, moisture,  rubbing (friction), and not enough air circulation. The condition can be made worse by:  Sweat.  Bacteria.  A fungus, such as yeast. What increases the risk? This condition is more likely to occur if you have moisture in your skin folds. You are more likely to develop  this condition if you:  Have diabetes.  Are overweight.  Are not able to move around or are not active.  Live in a warm and moist climate.  Wear splints, braces, or other medical devices.  Are not able to control your bowels or bladder (have incontinence). What are the signs or symptoms? Symptoms of this condition include:  A pink or red skin rash in the skin fold or near the skin fold.  Raw or scaly skin.  Itchiness.  A burning feeling.  Bleeding.  Leaking fluid.  A bad smell. How is this diagnosed? This condition is diagnosed with a medical history and physical exam. You may also have a skin swab to test for bacteria or a fungus. How is this treated? This condition may be treated by:  Cleaning and drying your skin.  Taking an antibiotic medicine or using an antibiotic skin cream for a bacterial infection.  Using an antifungal cream on your skin or taking pills for an infection that was caused by a fungus, such as yeast.  Using a steroid ointment to relieve itchiness and irritation.  Separating the skin fold with a clean cotton cloth to absorb moisture and allow air to flow into the area. Follow these instructions at home:  Keep the affected area clean and dry.  Do not scratch your skin.  Stay in a cool environment as much as possible. Use an air conditioner or fan, if available.  Apply over-the-counter and prescription medicines only as told by your health care provider.  If you were prescribed an antibiotic medicine, use it as told by your health care provider. Do not stop using the antibiotic even if your condition improves.  Keep all follow-up visits as told by your health care  provider. This is important. How is this prevented?   Maintain a healthy weight.  Take care of your feet, especially if you have diabetes. Foot care includes: ? Wearing shoes that fit well. ? Keeping your feet dry. ? Wearing clean, breathable socks.  Protect the skin around your groin and buttocks, especially if you have incontinence. Skin protection includes: ? Following a regular cleaning routine. ? Using skin protectant creams, powders, or ointments. ? Changing protection pads frequently.  Do not wear tight clothes. Wear clothes that are loose, absorbent, and made of cotton.  Wear a bra that gives good support, if needed.  Shower and dry yourself well after activity or exercise. Use a hair dryer on a cool setting to dry between skin folds, especially after you bathe.  If you have diabetes, keep your blood sugar under control. Contact a health care provider if:  Your symptoms do not improve with treatment.  Your symptoms get worse or they spread.  You notice increased redness and warmth.  You have a fever. Summary  Intertrigo is skin irritation or inflammation (dermatitis) that occurs when folds of skin rub together.  This condition is caused by heat, moisture, rubbing (friction), and not enough air circulation.  This condition may be treated by cleaning and drying your skin and with medicines.  Apply over-the-counter and prescription medicines only as told by your health care provider.  Keep all follow-up visits as told by your health care provider. This is important. This information is not intended to replace advice given to you by your health care provider. Make sure you discuss any questions you have with your health care provider. Document Revised: 03/07/2018 Document Reviewed: 03/07/2018 Elsevier Patient Education  2020 [place]  Liver Disease  Fatty liver disease occurs when too much fat has built up in your liver cells. Fatty liver disease is  also called hepatic steatosis or steatohepatitis. The liver removes harmful substances from your bloodstream and produces fluids that your body needs. It also helps your body use and store energy from the food you eat. In many cases, fatty liver disease does not cause symptoms or problems. It is often diagnosed when tests are being done for other reasons. However, over time, fatty liver can cause inflammation that may lead to more serious liver problems, such as scarring of the liver (cirrhosis) and liver failure. Fatty liver is associated with insulin resistance, increased body fat, high blood pressure (hypertension), and high cholesterol. These are features of metabolic syndrome and increase your risk for stroke, diabetes, and heart disease. What are the causes? This condition may be caused by:  Drinking too much alcohol.  Poor nutrition.  Obesity.  Cushing's syndrome.  Diabetes.  High cholesterol.  Certain drugs.  Poisons.  Some viral infections.  Pregnancy. What increases the risk? You are more likely to develop this condition if you:  Abuse alcohol.  Are overweight.  Have diabetes.  Have hepatitis.  Have a high triglyceride level.  Are pregnant. What are the signs or symptoms? Fatty liver disease often does not cause symptoms. If symptoms do develop, they can include:  Fatigue.  Weakness.  Weight loss.  Confusion.  Abdominal pain.  Nausea and vomiting.  Yellowing of your skin and the white parts of your eyes (jaundice).  Itchy skin. How is this diagnosed? This condition may be diagnosed by:  A physical exam and medical history.  Blood tests.  Imaging tests, such as an ultrasound, CT scan, or MRI.  A liver biopsy. A small sample of liver tissue is removed using a needle. The sample is then looked at under a microscope. How is this treated? Fatty liver disease is often caused by other health conditions. Treatment for fatty liver may involve  medicines and lifestyle changes to manage conditions such as:  Alcoholism.  High cholesterol.  Diabetes.  Being overweight or obese. Follow these instructions at home:   Do not drink alcohol. If you have trouble quitting, ask your health care provider how to safely quit with the help of medicine or a supervised program. This is important to keep your condition from getting worse.  Eat a healthy diet as told by your health care provider. Ask your health care provider about working with a diet and nutrition specialist (dietitian) to develop an eating plan.  Exercise regularly. This can help you lose weight and control your cholesterol and diabetes. Talk to your health care provider about an exercise plan and which activities are best for you.  Take over-the-counter and prescription medicines only as told by your health care provider.  Keep all follow-up visits as told by your health care provider. This is important. Contact a health care provider if: You have trouble controlling your:  Blood sugar. This is especially important if you have diabetes.  Cholesterol.  Drinking of alcohol. Get help right away if:  You have abdominal pain.  You have jaundice.  You have nausea and vomiting.  You vomit blood or material that looks like coffee grounds.  You have stools that are black, tar-like, or bloody. Summary  Fatty liver disease develops when too much fat builds up in the cells of your liver.  Fatty liver disease often causes no symptoms or problems. However, over  time, fatty liver can cause inflammation that may lead to more serious liver problems, such as scarring of the liver (cirrhosis).  You are more likely to develop this condition if you abuse alcohol, are pregnant, are overweight, have diabetes, have hepatitis, or have high triglyceride levels.  Contact your health care provider if you have trouble controlling your weight, blood sugar, cholesterol, or drinking of  alcohol. This information is not intended to replace advice given to you by your health care provider. Make sure you discuss any questions you have with your health care provider. Document Revised: 09/17/2017 Document Reviewed: 07/14/2017 Elsevier Patient Education  2020 [place].   Nonalcoholic Fatty Liver Disease Diet, Adult Nonalcoholic fatty liver disease is a condition that causes fat to build up in and around the liver. The disease makes it harder for the liver to work the way that it should. Following a healthy diet can help to keep nonalcoholic fatty liver disease under control. It can also help to prevent or improve conditions that are associated with the disease, such as heart disease, diabetes, high blood pressure, and abnormal cholesterol levels. Along with regular exercise, this diet:  Promotes weight loss.  Helps to control blood sugar levels.  Helps to improve the way that the body uses insulin. What are tips for following this plan? Reading food labels Always check food labels for:  The amount of saturated fat in a food. You should limit your intake of saturated fat. Saturated fat is found in foods that come from animals, including meat and dairy products such as butter, cheese, and whole milk.  The amount of fiber in a food. You should choose high-fiber foods such as fruits, vegetables, and whole grains. Try to get 25-30 grams (g) of fiber a day.  Cooking  When cooking, use heart-healthy oils that are high in monounsaturated fats. These include olive oil, canola oil, and avocado oil.  Limit frying or deep-frying foods. Cook foods using healthy methods such as baking, boiling, steaming, and grilling instead. Meal planning  You may want to keep track of how many calories you take in. Eating the right amount of calories will help you achieve a healthy weight. Meeting with a registered dietitian can help you get started.  Limit how often you eat takeout and fast  food. These foods are usually very high in fat, salt, and sugar.  Use the glycemic index (GI) to plan your meals. The index tells you how quickly a food will raise your blood sugar. Choose low-GI foods (GI less than 55). These foods take a longer time to raise blood sugar. A registered dietitian can help you identify foods lower on the GI scale. Lifestyle  You may want to follow a Mediterranean diet. This diet includes a lot of vegetables, lean meats or fish, whole grains, fruits, and healthy oils and fats. What foods can I eat?  Fruits Bananas. Apples. Oranges. Grapes. Papaya. Mango. Pomegranate. Kiwi. Grapefruit. Cherries. Vegetables Lettuce. Spinach. Peas. Beets. Cauliflower. Cabbage. Broccoli. Carrots. Tomatoes. Squash. Eggplant. Herbs. Peppers. Onions. Cucumbers. Brussels sprouts. Yams and sweet potatoes. Beans. Lentils. Grains Whole wheat or whole-grain foods, including breads, crackers, cereals, and pasta. Stone-ground whole wheat. Unsweetened oatmeal. Bulgur. Barley. Quinoa. Brown or wild rice. Corn or whole wheat flour tortillas. Meats and other proteins Lean meats. Poultry. Tofu. Seafood and shellfish. Dairy Low-fat or fat-free dairy products, such as yogurt, cottage cheese, or cheese. Beverages Water. Sugar-free drinks. Tea. Coffee. Low-fat or skim milk. Milk alternatives, such as soy or  almond milk. Real fruit juice. Fats and oils Avocado. Canola or olive oil. Nuts and nut butters. Seeds. Seasonings and condiments Mustard. Relish. Low-fat, low-sugar ketchup and barbecue sauce. Low-fat or fat-free mayonnaise. Sweets and desserts Sugar-free sweets. The items listed above may not be a complete list of foods and beverages you can eat. Contact a dietitian for more information. What foods should I limit or avoid? Meats and other proteins Limit red meat to 1-2 times a week. Dairy [employer]. Fats and oils Palm oil and coconut oil. Fried foods. Other foods Processed foods.  Foods that contain a lot of salt or sodium. Sweets and desserts Sweets that contain sugar. Beverages Sweetened drinks, such as sweet tea, milkshakes, iced sweet drinks, and sodas. Alcohol. The items listed above may not be a complete list of foods and beverages you should avoid. Contact a dietitian for more information. Where to find more information The [employer] of Diabetes and Digestive and Kidney Diseases: [email or web address] Summary  Nonalcoholic fatty liver disease is a condition that causes fat to build up in and around the liver.  Following a healthy diet can help to keep nonalcoholic fatty liver disease under control. Your diet should be rich in fruits, vegetables, whole grains, and lean proteins.  Limit your intake of saturated fat. Saturated fat is found in foods that come from animals, including meat and dairy products such as butter, cheese, and whole milk.  This diet promotes weight loss, helps to control blood sugar levels, and helps to improve the way that the body uses insulin. This information is not intended to replace advice given to you by your health care provider. Make sure you discuss any questions you have with your health care provider. Document Revised: 01/27/2019 Document Reviewed: 10/27/2018 Elsevier Patient Education  [place].

## 2019-11-21 NOTE — Progress Notes (Signed)
Virtual Visit via Video Note  I connected with Bill Herrera  on 11/21/19 at  3:20 PM EST by a video enabled telemedicine application and verified that I am speaking with the correct person using two identifiers.  Location patient: home Location provider:work or home office Persons participating in the virtual visit: patient, provider  I discussed the limitations of evaluation and management by telemedicine and the availability of in person appointments. The patient expressed understanding and agreed to proceed.   HPI: 1. C/o rash in groin area which he is using clobetasol cream for now which helps but does not go away completely area is red and not itching he does sweat in the creases but more so in the winter but does work hard dailiy  2. DM 2 A1C 6.8 on metformin 500 mg qd with breakfast A1C trending down from 10.5 was on metformin and Jardiance 10 mg but has not picked up since mid 09/2019 due to cost and high deductible He wants to know if he needs to continue this as he has to pay a lot for this medication   ROS: See pertinent positives and negatives per HPI.  Past Medical History:  Diagnosis Date  . Diabetes mellitus without complication ([place])   . Frequent headaches   . Lipoma    x4 removed with 12 more as of 09/22/2019    Past Surgical History:  Procedure Laterality Date  . right cyst removal     right forehead     Family History  Problem Relation Age of Onset  . Asthma Father   . Diabetes Father        2  . 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  glucose blood test strip, Use as instructed E11.9. Accu chek guid test strips check blood sugar in am before breakfast, after lunch and after dinner, Disp: 300 each, Rfl: 12 .  Lancets (ACCU-CHEK SOFT TOUCH) lancets, E 11.9 Use as instructed check blood sugar 3x per day, Disp: 300 each, Rfl: 12 .  metFORMIN (GLUCOPHAGE) 500 MG tablet, Take 1 tablet (500 mg  total) by mouth daily with breakfast., Disp: 90 tablet, Rfl: 3 .  clobetasol cream (TEMOVATE) [id number] %, Apply 1 application topically 2 (two) times daily. Right thigh x 1-2 weeks (Patient not taking: Reported on 11/21/2019), Disp: 45 g, Rfl: 2 .  empagliflozin (JARDIANCE) 10 MG TABS tablet, Take 10 mg by mouth daily. In am (Patient not taking: Reported on 11/21/2019), Disp: 90 tablet, Rfl: 3 .  fluconazole (DIFLUCAN) 150 MG tablet, Take 1 tablet (150 mg total) by mouth once a week. X 2-4 weeks, Disp: 4 tablet, Rfl: 0 .  miconazole (MICATIN) 2 % cream, Apply 1 application topically 2 (two) times daily. To groin x 1-2 weeks as needed groin area/inner thigh, Disp: 60 g, Rfl: 11 .  Powders (GOLD BOND NO MESS BODY POWDER) AERP, Apply 1 application topically 2 (two) times daily. Gold bond talc free powder to inner thigh groin, Disp: 200 g, Rfl: 11  EXAM:  VITALS per patient if applicable:  GENERAL: alert, oriented, appears well and in no acute distress  HEENT: atraumatic, conjunttiva clear, no obvious abnormalities on inspection of external nose and ears  NECK: normal movements of the head and neck  LUNGS: on inspection no signs of respiratory distress, breathing rate appears normal, no obvious gross SOB, gasping or wheezing  CV: no obvious cyanosis  MS: moves all visible extremities without noticeable abnormality  PSYCH/NEURO: pleasant and cooperative, no obvious depression or anxiety, speech and thought processing grossly intact  ASSESSMENT AND PLAN:  Discussed the following assessment and plan:  Intertrigo Tinea cruris - Plan: fluconazole (DIFLUCAN) 150 MG tablet 1X PER WEEK X 2-4 WEEKS, miconazole (MICATIN) 2 % cream BID, Powders (GOLD BOND NO MESS BODY POWDER) TALC FREE POWDER STOP CLOBETASOL TO THE GROIN  IF NOT GOING AWAY REC DERMATOLOGY   DM 2 IMPROVED A1C 6.8 FROM 10.5  ON METFORMIN 500 MG QD  NOT ON JARDIANCE SINCE 09/2019 AFTER LABS 09/29/19 due to cost  Will check fasting labs  mid 12/2019  Continue healthy diet and exercise  Do foot exam in future and will rec eye exam  Has seen nutrition which was helpful  HM Flu utd  sch fasting labs 12/2019  tdap utd  Consider covid 19 vaccine, pna 23 vaccine Consider hep B vaccine new x 2 doses hep A immune; h/o fatty liver  HIV negative  rec healthy diet and exercise given info   -we discussed possible serious and likely etiologies, options for evaluation and workup, limitations of telemedicine visit vs in person visit, treatment, treatment risks and precautions. Pt prefers to treat via telemedicine empirically rather then risking or undertaking an in person visit at this moment. Patient agrees to seek prompt in person care if worsening, new symptoms arise, or if is not improving with treatment.   I discussed the assessment and treatment plan with the patient. The patient was provided an opportunity to ask questions and all were answered. The patient agreed with the plan and demonstrated an understanding of the instructions.   The patient was advised to call back or seek an in-person evaluation if the symptoms worsen or if the condition fails to improve as anticipated.  Time spent 20 minutes  Delorise Jackson, MD

## 2019-12-29 ENCOUNTER — Ambulatory Visit: Payer: Commercial Managed Care - PPO | Admitting: Internal Medicine

## 2020-01-05 ENCOUNTER — Other Ambulatory Visit: Payer: Commercial Managed Care - PPO

## 2020-01-12 ENCOUNTER — Telehealth: Payer: Self-pay | Admitting: Internal Medicine

## 2020-01-12 ENCOUNTER — Other Ambulatory Visit: Payer: Self-pay

## 2020-01-12 ENCOUNTER — Other Ambulatory Visit (INDEPENDENT_AMBULATORY_CARE_PROVIDER_SITE_OTHER): Payer: Commercial Managed Care - PPO

## 2020-01-12 DIAGNOSIS — E119 Type 2 diabetes mellitus without complications: Secondary | ICD-10-CM

## 2020-01-12 LAB — LIPID PANEL
Cholesterol: 177 mg/dL (ref 0–200)
HDL: 28.5 mg/dL — ABNORMAL LOW (ref >39.00)
NonHDL: 148.25
Total CHOL/HDL Ratio: 6
Triglycerides: 266 mg/dL — ABNORMAL HIGH (ref 0.0–149.0)
VLDL: 53.2 mg/dL — ABNORMAL HIGH (ref 0.0–40.0)

## 2020-01-12 LAB — COMPREHENSIVE METABOLIC PANEL
ALT: 26 U/L (ref 0–53)
AST: 17 U/L (ref 0–37)
Albumin: 4.3 g/dL (ref 3.5–5.2)
Alkaline Phosphatase: 90 U/L (ref 39–117)
BUN: 12 mg/dL (ref 6–23)
CO2: 27 mEq/L (ref 19–32)
Calcium: 9.3 mg/dL (ref 8.4–10.5)
Chloride: 102 mEq/L (ref 96–112)
Creatinine, Ser: 0.88 mg/dL (ref 0.40–1.50)
GFR: 97.4 mL/min (ref >60.00)
Glucose, Bld: 181 mg/dL — ABNORMAL HIGH (ref 70–99)
Potassium: 3.6 mEq/L (ref 3.5–5.1)
Sodium: 135 mEq/L (ref 135–145)
Total Bilirubin: 0.6 mg/dL (ref 0.2–1.2)
Total Protein: 7.1 g/dL (ref 6.0–8.3)

## 2020-01-12 LAB — HEMOGLOBIN A1C: Hgb A1c MFr Bld: 8 % — ABNORMAL HIGH (ref 4.6–6.5)

## 2020-01-12 LAB — LDL CHOLESTEROL, DIRECT: Direct LDL: 102 mg/dL

## 2020-01-12 NOTE — Telephone Encounter (Signed)
A1C 8.0 up from 6.8 on metformin correct? And stopped jardiance?  Does he want to try Purcell Mouton which is metformin and cousin of jardiance combined?   Or  Metformin and Januvia combined?  Or  Injection of ozempic 1x per week ?    His A1C gone up and he needs another medication    Liver kidneys normal   Cholesterol elevated  -is he agreeable to try statin cholesterol medication?   [place]

## 2020-01-15 ENCOUNTER — Other Ambulatory Visit: Payer: Self-pay | Admitting: Internal Medicine

## 2020-01-15 DIAGNOSIS — E1165 Type 2 diabetes mellitus with hyperglycemia: Secondary | ICD-10-CM

## 2020-01-15 MED ORDER — PRAVASTATIN SODIUM 20 MG PO TABS: 20.0000 mg | ORAL_TABLET | Freq: Every day | ORAL | 3 refills | Status: DC

## 2020-01-15 MED ORDER — XIGDUO XR 10-500 MG PO TB24: 1.0000 | ORAL_TABLET | Freq: Every day | ORAL | 3 refills | Status: DC

## 2020-01-15 NOTE — Telephone Encounter (Signed)
Make sure appt 04/2020 pt comes into office  Thanks tMS

## 2020-01-15 NOTE — Telephone Encounter (Signed)
Patient informed and verbalized understanding.  Pt is agreeable to starting Xigduo and Statin therapy.   Pt uses [facility] in [place] for medications now.

## 2020-04-03 IMAGING — MR MRI OF THE LEFT HUMERUS WITHOUT AND WITH CONTRAST
12 of 14 series · 33 of 40 positions shown · IV contrast (Gadavist)
Comparison: None.

CLINICAL DATA: Left upper arm pain for approximately 1 year.
Question neurofibromatosis or lipoma.

EXAM:
MRI OF THE LEFT HUMERUS WITHOUT AND WITH CONTRAST
TECHNIQUE: Multiplanar, multisequence MR imaging of the left humerus was
performed before and after the administration of intravenous
contrast.
CONTRAST:  8 cc Gadavist IV.

[Series 2: STIR · axial · left · 5.0mm · 0.57mm/px · z∈[-33,+150]mm · 3 of 34 slices shown (1 of 2)]
[im 1/34]
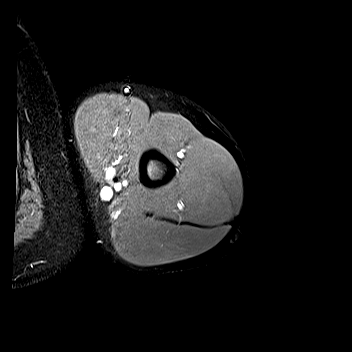
[im 17/34]
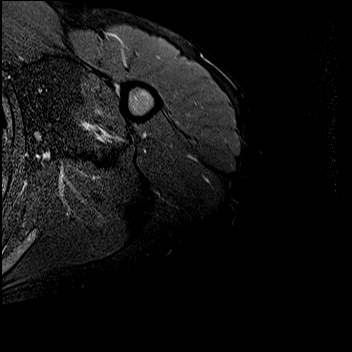
[im 34/34]
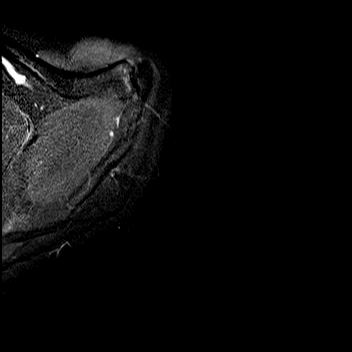

[Series 3: STIR · axial · left · 5.0mm · 0.57mm/px · 1 of 34 slices shown (2 of 2)]
[im 1/34]
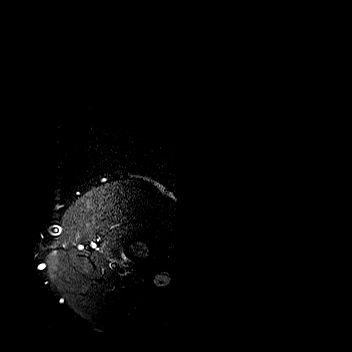

[Series 4: T1 · axial · left · 5.0mm · 0.66mm/px · z∈[-33,+150]mm · 3 of 34 slices shown (1 of 4)]
[im 1/34]
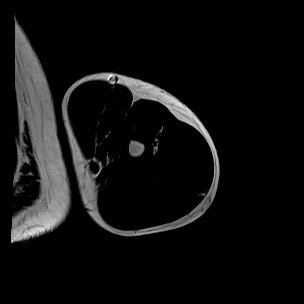
[im 17/34]
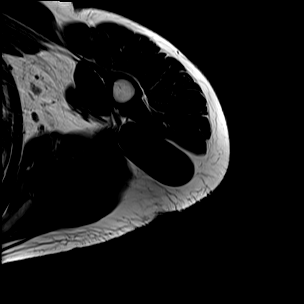
[im 34/34]
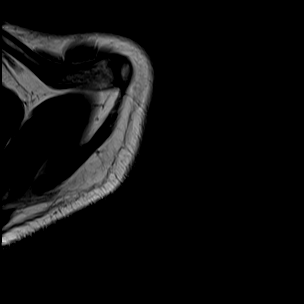

[Series 5: T1 · axial · left · 5.0mm · 0.66mm/px · z∈[-186,-1]mm · 3 of 34 slices shown (2 of 4)]
[im 1/34]
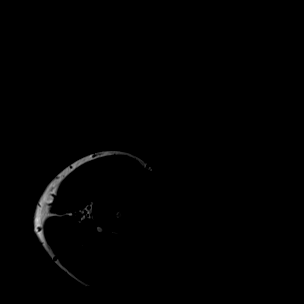
[im 17/34]
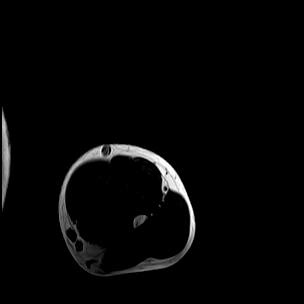
[im 34/34]
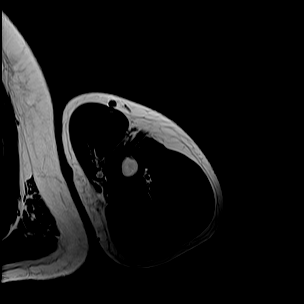

[Series 7: T1 · oblique · left · 4.0mm · 0.35mm/px · 3 of 33 slices shown (3 of 4)]
[im 1/33]
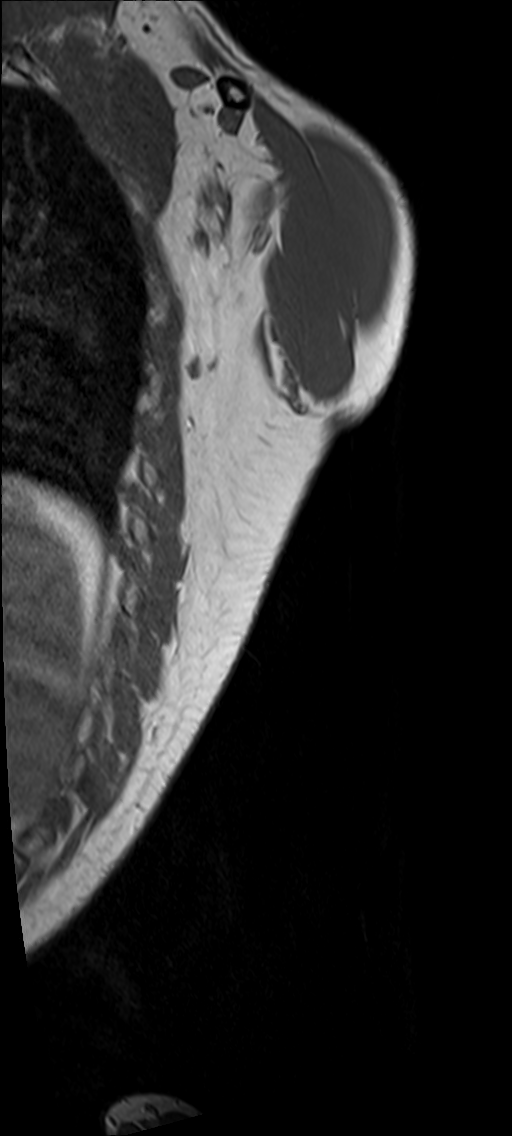
[im 17/33]
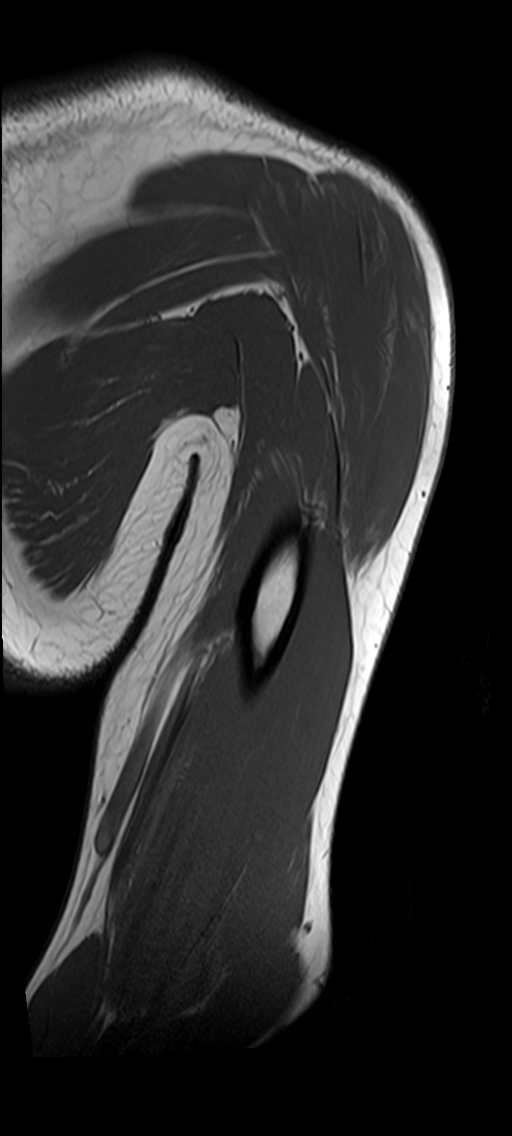
[im 33/33]
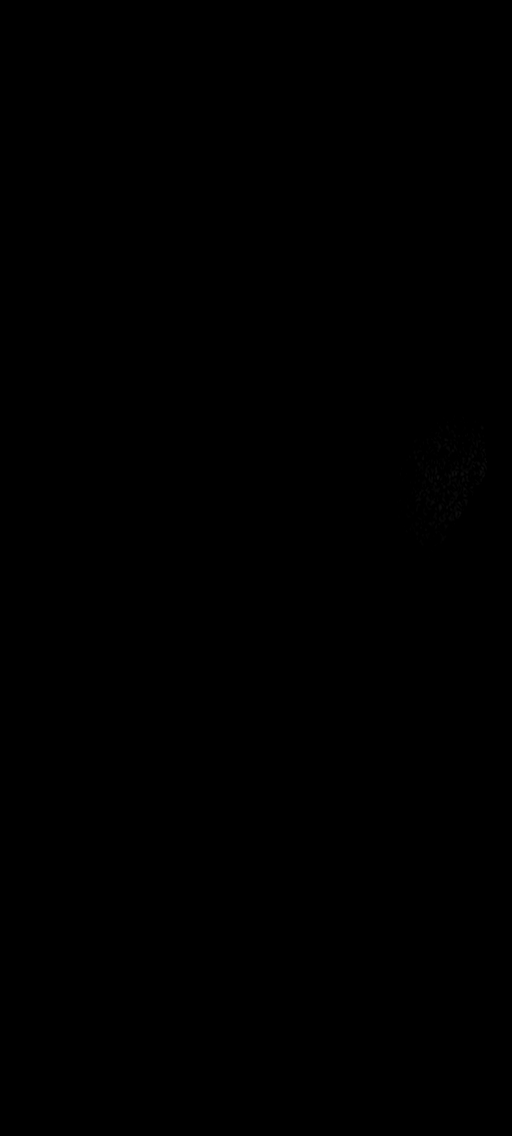

[Series 11: T1 fat-sat · axial · non-contrast · left · 5.0mm · 0.66mm/px · z∈[-33,+150]mm · 3 of 34 slices shown (1 of 2)]
[im 1/34]
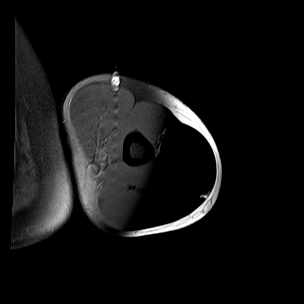
[im 17/34]
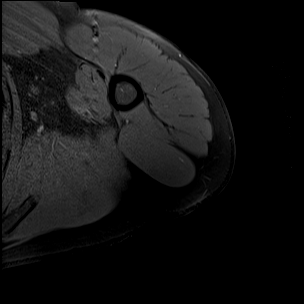
[im 34/34]
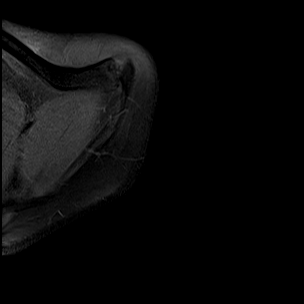

[Series 12: T1 fat-sat · axial · non-contrast · left · 5.0mm · 0.66mm/px · z∈[-186,-1]mm · 3 of 34 slices shown (2 of 2)]
[im 1/34]
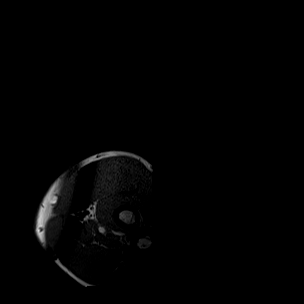
[im 17/34]
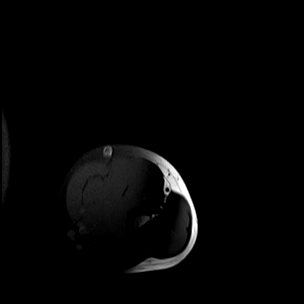
[im 34/34]
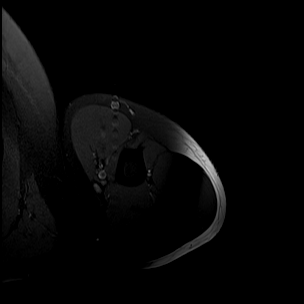

[Series 13: T1 · oblique · left · 4.0mm · 0.82mm/px · 2 of 25 slices shown (4 of 4)]
[im 1/25]
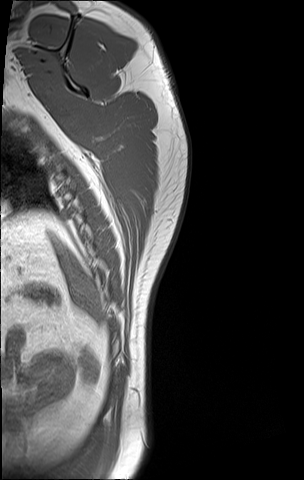
[im 25/25]
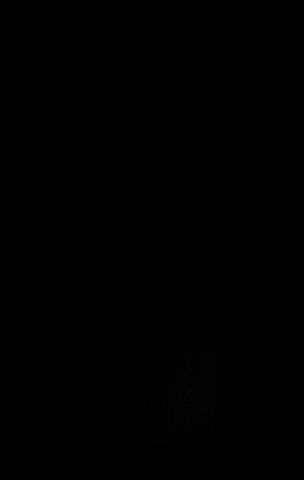

[Series 16: T1 fat-sat post-contrast · axial · left · 5.0mm · 0.66mm/px · z∈[-33,+150]mm · 3 of 34 slices shown (1 of 4)]
[im 1/34]
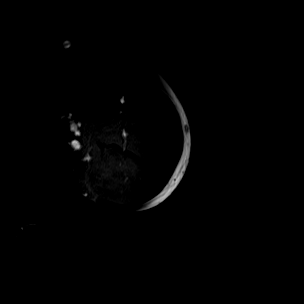
[im 17/34]
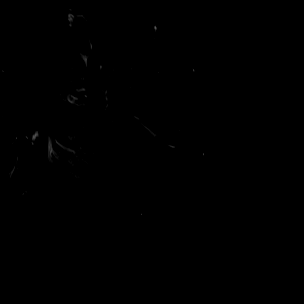
[im 34/34]
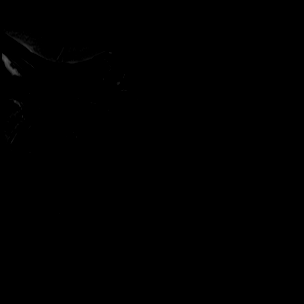

[Series 17: T1 fat-sat post-contrast · axial · left · 5.0mm · 0.66mm/px · z∈[-186,-1]mm · 3 of 34 slices shown (2 of 4)]
[im 1/34]
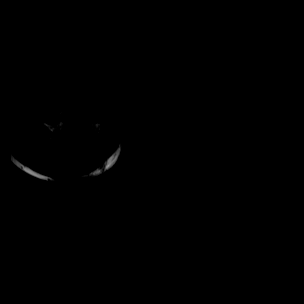
[im 17/34]
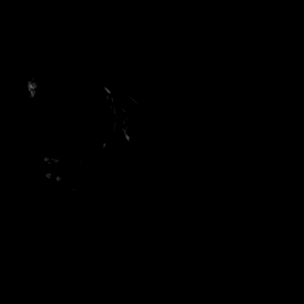
[im 34/34]
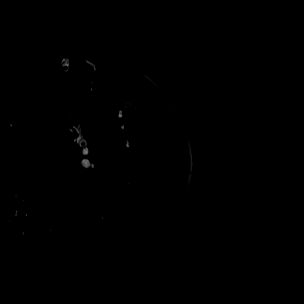

[Series 18: T1 fat-sat post-contrast · oblique · left · 4.0mm · 0.70mm/px · 3 of 33 slices shown (3 of 4)]
[im 1/33]
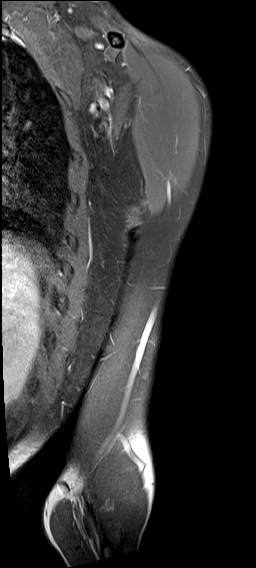
[im 17/33]
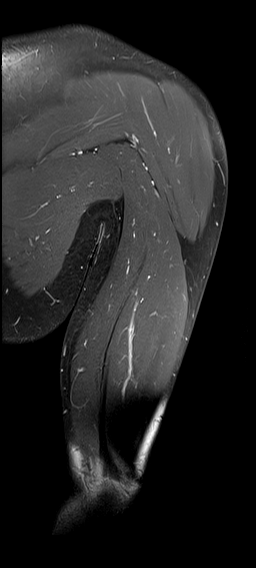
[im 33/33]
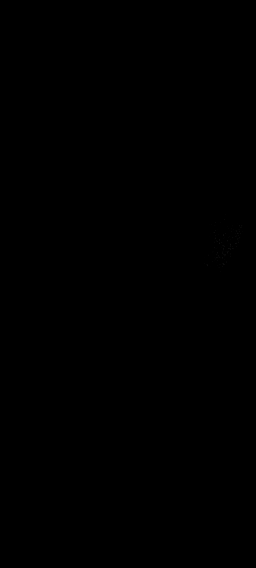

[Series 19: T1 fat-sat post-contrast · oblique · left · 4.0mm · 0.70mm/px · 3 of 33 slices shown (4 of 4)]
[im 1/33]
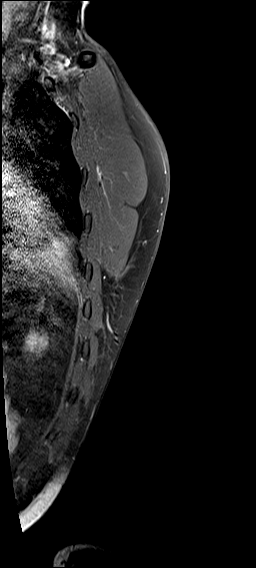
[im 17/33]
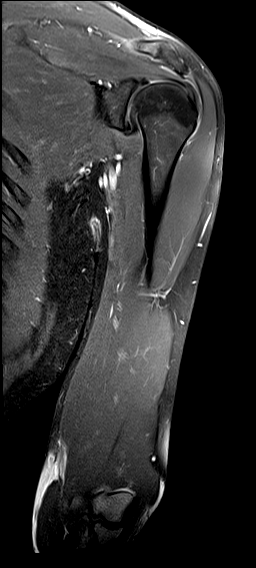
[im 33/33]
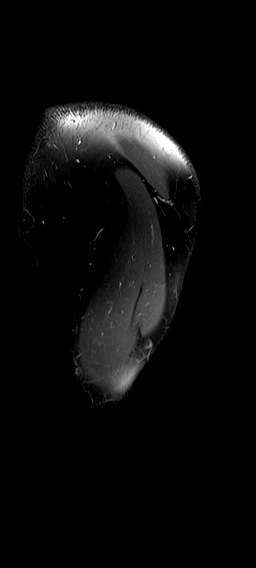

[33 of 40 positions shown; findings below may reference images not displayed]

FINDINGS: Bones/Joint/Cartilage

Normal signal throughout without fracture or focal lesion.

Ligaments

Normal.

Muscles and Tendons

Intact and normal in appearance. No intramuscular mass or fluid
collection.

Soft tissues

Appear normal. No mass or fluid collection. No pathologic
enhancement after contrast administration.
IMPRESSION: Normal examination.  No finding to explain a palpated abnormality.

## 2020-04-19 ENCOUNTER — Ambulatory Visit: Payer: Commercial Managed Care - PPO | Admitting: Internal Medicine

## 2020-04-26 ENCOUNTER — Ambulatory Visit: Payer: Commercial Managed Care - PPO | Admitting: Internal Medicine

## 2020-04-26 ENCOUNTER — Encounter: Payer: Self-pay | Admitting: Internal Medicine

## 2020-04-26 ENCOUNTER — Other Ambulatory Visit: Payer: Self-pay

## 2020-04-26 VITALS — BP 118/76 | HR 72 | Temp 98.9°F | Ht 65.0 in | Wt 176.8 lb

## 2020-04-26 DIAGNOSIS — R21 Rash and other nonspecific skin eruption: Secondary | ICD-10-CM | POA: Diagnosis not present

## 2020-04-26 DIAGNOSIS — E119 Type 2 diabetes mellitus without complications: Secondary | ICD-10-CM | POA: Diagnosis not present

## 2020-04-26 DIAGNOSIS — B356 Tinea cruris: Secondary | ICD-10-CM | POA: Diagnosis not present

## 2020-04-26 DIAGNOSIS — Z0000 Encounter for general adult medical examination without abnormal findings: Secondary | ICD-10-CM

## 2020-04-26 DIAGNOSIS — Z1329 Encounter for screening for other suspected endocrine disorder: Secondary | ICD-10-CM | POA: Diagnosis not present

## 2020-04-26 LAB — POCT GLYCOSYLATED HEMOGLOBIN (HGB A1C): Hemoglobin A1C: 7 % — AB (ref 4.0–5.6)

## 2020-04-26 MED ORDER — RYBELSUS 3 MG PO TABS: 1.0000 | ORAL_TABLET | Freq: Every day | ORAL | 2 refills | Status: DC

## 2020-04-26 NOTE — Progress Notes (Signed)
Chief Complaint  Patient presents with  . Follow-up  . Rash   F/u  1. DM 2 A1C 8.0 01/12/20 on xigduo 10-500 and tolerating but has rash at the upper crease of buttocks, penis and inguinal region b/l red and was itchy but is not itchy any longer and small breaks at penis  2. Rash agreeable to see dermatology referral today tried micatin, diflucan w/o relief and gold bond powder    Review of Systems  Respiratory: Negative for shortness of breath.   Cardiovascular: Negative for chest pain.  Skin: Positive for rash.       Spider veins legs   Psychiatric/Behavioral: Negative for depression.   Past Medical History:  Diagnosis Date  . Diabetes mellitus without complication ([place])   . Frequent headaches   . Lipoma    x4 removed with 12 more as of 09/22/2019   Past Surgical History:  Procedure Laterality Date  . right cyst removal     right forehead    Family History  Problem Relation Age of Onset  . Asthma Father   . Diabetes Father        2  . Hypertension Father    Social History   Socioeconomic History  . Marital status: Single    Spouse name: Not on file  . Number of children: Not on file  . Years of education: Not on file  . Highest education level: Not on file  Occupational History  . Not on file  Tobacco Use  . Smoking status: Never Smoker  . Smokeless tobacco: Never Used  Substance and Sexual Activity  . Alcohol use: Yes    Alcohol/week: 2.0 standard drinks    Types: 2 Cans of beer per week  . Drug use: Not Currently  . Sexual activity: Yes    Partners: Female  Other Topics Concern  . Not on file  Social History Narrative   HS ed    HVAC    Single 2 kids    Social Determinants of Health   Financial Resource Strain:   . Difficulty of Paying Living Expenses:   Food Insecurity:   . Worried About Charity fundraiser in the Last Year:   . Arboriculturist in the Last Year:   Transportation Needs:   . Film/video editor (Medical):   Marland Kitchen Lack of  Transportation (Non-Medical):   Physical Activity:   . Days of Exercise per Week:   . Minutes of Exercise per Session:   Stress:   . Feeling of Stress :   Social Connections:   . Frequency of Communication with Friends and Family:   . Frequency of Social Gatherings with Friends and Family:   . Attends Religious Services:   . Active Member of Clubs or Organizations:   . Attends Archivist Meetings:   Marland Kitchen Marital Status:   Intimate Partner Violence:   . Fear of Current or Ex-Partner:   . Emotionally Abused:   Marland Kitchen Physically Abused:   . Sexually Abused:    Current Meds  Medication Sig  . clobetasol cream (TEMOVATE) 6.30 % Apply 1 application topically 2 (two) times daily. Right thigh x 1-2 weeks  . glucose blood test strip Use as instructed E11.9. Accu chek guid test strips check blood sugar in am before breakfast, after lunch and after dinner  . Lancets (ACCU-CHEK SOFT TOUCH) lancets E 11.9 Use as instructed check blood sugar 3x per day  . miconazole (MICATIN) 2 % cream Apply 1  application topically 2 (two) times daily. To groin x 1-2 weeks as needed groin area/inner thigh  . Powders (GOLD BOND NO MESS BODY POWDER) AERP Apply 1 application topically 2 (two) times daily. Gold bond talc free powder to inner thigh groin  . pravastatin (PRAVACHOL) 20 MG tablet Take 1 tablet (20 mg total) by mouth at bedtime.  . [DISCONTINUED] Dapagliflozin-metFORMIN HCl ER (XIGDUO XR) 10-500 MG TB24 Take 1 tablet by mouth daily. In am. Stop metformin and stop jardiance. Drink plenty of water   No Known Allergies Recent Results (from the past 2160 hour(s))  POCT glycosylated hemoglobin (Hb A1C)     Status: Abnormal   Collection Time: 04/26/20  2:55 PM  Result Value Ref Range   Hemoglobin A1C 7.0 (A) 4.0 - 5.6 %   HbA1c POC (<> result, manual entry)     HbA1c, POC (prediabetic range)     HbA1c, POC (controlled diabetic range)     Objective  Body mass index is 29.42 kg/m. Wt Readings from Last  3 Encounters:  04/26/20 176 lb 12.8 oz (80.2 kg)  11/21/19 170 lb (77.1 kg)  09/29/19 175 lb 3.2 oz (79.5 kg)   Temp Readings from Last 3 Encounters:  04/26/20 98.9 F (37.2 C) (Oral)  06/30/19 97.7 F (36.5 C)  06/02/19 (!) 97.5 F (36.4 C)   BP Readings from Last 3 Encounters:  04/26/20 118/76  06/30/19 114/73  06/02/19 118/76   Pulse Readings from Last 3 Encounters:  04/26/20 72  06/30/19 62  06/02/19 67    Physical Exam Vitals and nursing note reviewed.  Constitutional:      Appearance: Normal appearance. He is well-developed and well-groomed.  HENT:     Head: Normocephalic and atraumatic.  Eyes:     Conjunctiva/sclera: Conjunctivae normal.     Pupils: Pupils are equal, round, and reactive to light.  Cardiovascular:     Rate and Rhythm: Normal rate and regular rhythm.     Heart sounds: Normal heart sounds. No murmur heard.   Pulmonary:     Effort: Pulmonary effort is normal.     Breath sounds: Normal breath sounds.  Skin:    General: Skin is warm and dry.     Findings: Erythema and rash present.       Neurological:     General: No focal deficit present.     Mental Status: He is alert and oriented to person, place, and time. Mental status is at baseline.     Gait: Gait normal.  Psychiatric:        Attention and Perception: Attention and perception normal.        Mood and Affect: Mood and affect normal.        Speech: Speech normal.        Behavior: Behavior normal. Behavior is cooperative.        Thought Content: Thought content normal.        Cognition and Memory: Cognition and memory normal.        Judgment: Judgment normal.     Assessment  Plan  Rash - Plan: Ambulatory referral to Dermatology  Tinea cruris - Plan: Ambulatory referral to Dermatology  Type 2 diabetes mellitus without complication, without long-term current use of insulin ([place]) - Plan: Semaglutide (RYBELSUS) 3 MG TABS, POCT glycosylated hemoglobin (Hb A1C), Comprehensive metabolic  panel, Lipid panel, CBC with Differential/Platelet, TSH, Hemoglobin A1c, Urinalysis, Routine w reflex microscopic, Microalbumin / creatinine urine ratio Stop Xigduo 10-500 due to rash ? Tinea  A1C 7.0 today    HM Flu utd  sch fasting labs 12/2019  tdap utd  moderna 2/2  Consider pna 23 vaccine Consider hep B vaccine new x 2 doses hep A immune; h/o fatty liver  HIV negative  rec healthy diet and exercise given info   Provider: Dr. Olivia Mackie McLean-Scocuzza-[facility]

## 2020-04-26 NOTE — Patient Instructions (Addendum)
Simply lemonade light   Stop Xigduo for now    Try Rybelsus 1 pill daily    Diabetes mellitus y nutricin, en adultos Diabetes Mellitus and Nutrition, Adult Si sufre de diabetes (diabetes mellitus), es muy importante tener hbitos alimenticios saludables debido a que sus niveles de Designer, television/film set sangre (glucosa) se ven afectados en gran medida por lo que come y bebe. [place] alimentos saludables en las cantidades [place], aproximadamente a la [employer], [place] ayudar a:  Aeronautical engineer glucemia.  Disminuir el riesgo de sufrir una enfermedad cardaca.  Mejorar la presin arterial.  Science writer o mantener un peso saludable. Todas las personas que sufren de diabetes son diferentes y cada una tiene necesidades diferentes en cuanto a un plan de alimentacin. El mdico puede recomendarle que trabaje con un especialista en dietas y nutricin (nutricionista) para Financial trader plan para usted. Su plan de alimentacin puede variar segn factores como:  Las caloras que necesita.  Los medicamentos que toma.  Su peso.  Sus niveles de glucemia, presin arterial y colesterol.  Su nivel de [place].  Otras afecciones que tenga, como enfermedades cardacas o renales. Cmo me afectan los carbohidratos? Los carbohidratos, o hidratos de carbono, afectan su nivel de glucemia ms que cualquier otro tipo de alimento. La ingesta de carbohidratos naturalmente aumenta la cantidad de [employer]. El recuento de carbohidratos es un mtodo destinado a Catering manager un registro de la cantidad de carbohidratos que se consumen. El recuento de carbohidratos es importante para Theatre manager la glucemia a un nivel saludable, especialmente si utiliza insulina o toma determinados medicamentos por va oral para la diabetes. Es importante conocer la cantidad de carbohidratos que se pueden ingerir en cada comida sin correr Engineer, manufacturing. Esto es Psychologist, forensic. Su nutricionista puede ayudarlo a  calcular la cantidad de carbohidratos que debe ingerir en cada comida y en cada refrigerio. Entre los alimentos que contienen carbohidratos, se incluyen:  Pan, cereal, arroz, pastas y galletas.  Papas y maz.  Guisantes, frijoles y lentejas.  Leche y Estate agent.  Lambert Mody y [place].  Postres, como pasteles, galletas, helado y caramelos. Cmo me afecta el alcohol? El alcohol puede provocar disminuciones sbitas de la glucemia (hipoglucemia), especialmente si utiliza insulina o toma determinados medicamentos por va oral para la diabetes. La hipoglucemia es una afeccin potencialmente mortal. Los sntomas de la hipoglucemia (somnolencia, mareos y confusin) son similares a los sntomas de haber consumido demasiado alcohol. Si el mdico afirma que el alcohol es seguro para usted, [place] estas pautas:  Limite el consumo de alcohol a no ms de 41medida por da si es mujer y no est [place], y a 87medidas si es hombre. Una medida equivale a 12oz (342ml) de cerveza, 5oz (154ml) de vino o 1oz (69ml) de bebidas alcohlicas de alta graduacin.  No beba con el estmago vaco.  Mantngase hidratado bebiendo agua, refrescos dietticos o t helado sin azcar.  Tenga en cuenta que los refrescos comunes, los jugos y otras bebida para Optician, dispensing pueden contener mucha azcar y se deben contar como carbohidratos. Cules son algunos consejos para seguir este plan?  Leer las etiquetas de los alimentos  Comience por leer el tamao de la porcin en la "Informacin nutricional" en las etiquetas de los alimentos envasados y las bebidas. La cantidad de caloras, carbohidratos, grasas y otros nutrientes mencionados en la etiqueta se basan en una porcin del alimento. Muchos alimentos contienen ms de una porcin por envase.  Verifique la cantidad total  de gramos (g) de carbohidratos totales en una porcin. Puede calcular la cantidad de porciones de carbohidratos al dividir el total de carbohidratos por 15. Por  ejemplo, si un alimento tiene un total de 30g de carbohidratos, equivale a 2 porciones de carbohidratos.  Verifique la cantidad de gramos (g) de grasas saturadas y grasas trans en una porcin. Escoja alimentos que no contengan grasa o que tengan un bajo contenido.  Verifique la cantidad de miligramos (mg) de sal (sodio) en una porcin. La mayora de las personas deben limitar la ingesta de sodio total a menos de 2300mg  por Training and development officer.  Siempre consulte la informacin nutricional de los alimentos etiquetados como "con bajo contenido de grasa" o "sin grasa". Estos alimentos pueden tener un mayor contenido de Location manager agregada o carbohidratos refinados, y deben evitarse.  Hable con su nutricionista para identificar sus objetivos diarios en cuanto a los nutrientes mencionados en la etiqueta. Al ir de compras  Evite comprar alimentos procesados, enlatados o precocinados. Estos alimentos tienden a Special educational needs teacher mayor cantidad de [place], sodio y azcar agregada.  Compre en la zona exterior de la tienda de comestibles. Esta zona incluye frutas y verduras frescas, granos a granel, carnes frescas y productos lcteos frescos. Al cocinar  Utilice mtodos de coccin a baja temperatura, como hornear, en lugar de mtodos de coccin a alta temperatura, como frer en abundante aceite.  Cocine con aceites saludables, como el aceite de [place], canola o [place].  Evite cocinar con manteca, crema o carnes con alto contenido de grasa. Planificacin de las comidas  Coma las comidas y los refrigerios regularmente, preferentemente a la misma hora todos [place]. Evite pasar largos perodos de tiempo sin comer.  Consuma alimentos ricos en fibra, como frutas frescas, verduras, frijoles y cereales integrales. Consulte a su nutricionista sobre cuntas porciones de carbohidratos puede consumir en cada comida.  Consuma entre 4 y 6 onzas (oz) de protenas magras por da, como carnes [place], pollo, pescado, huevos o tofu. Una onza de  protena magra equivale a: ? 1 onza de carne, pollo o pescado. ? 1huevo. ?  taza de tofu.  Coma algunos alimentos por da que contengan grasas saludables, como aguacates, frutos secos, semillas y pescado. Estilo de vida  Controle su nivel de glucemia con regularidad.  Haga actividad fsica habitualmente como se lo haya indicado el mdico. Esto puede incluir lo siguiente: ? 169minutos semanales de ejercicio de intensidad moderada o alta. Esto podra incluir caminatas dinmicas, ciclismo o gimnasia acutica. ? Realizar ejercicios de elongacin y de fortalecimiento, como yoga o levantamiento de pesas, por lo menos 2veces por semana.  Tome los [employer] se lo haya indicado el mdico.  No consuma ningn producto que contenga nicotina o tabaco, como cigarrillos y Psychologist, sport and exercise. Si necesita ayuda para dejar de fumar, consulte al [employer] con un asesor o instructor en diabetes para identificar estrategias para controlar el estrs y cualquier desafo emocional y social. Preguntas para hacerle al mdico  Es necesario que consulte a Radio broadcast assistant en el cuidado de la diabetes?  Es necesario que me rena con un nutricionista?  A qu nmero puedo llamar si tengo preguntas?  Cules son los mejores momentos para controlar la glucemia? Dnde encontrar ms informacin:  Asociacin Estadounidense de la Diabetes (American Diabetes Association): diabetes.org  Academia de Nutricin y Information systems manager (Academy of Nutrition and Dietetics): www.eatright.[place] Diabetes y las Enfermedades Digestivas y [place] of Diabetes and Digestive and Kidney Diseases, NIH): [employer]inesFuneral.dk  Resumen  Un plan de alimentacin saludable lo ayudar a controlar la glucemia y Theatre manager un estilo de vida saludable.  Trabajar con un especialista en dietas y nutricin (nutricionista) puede ayudarlo a Insurance claims handler de alimentacin para  usted.  Tenga en cuenta que los carbohidratos (hidratos de carbono) y el alcohol tienen efectos inmediatos en sus niveles de glucemia. Es importante contar los carbohidratos que ingiere y consumir alcohol con prudencia. Esta informacin no tiene Marine scientist el consejo del mdico. Asegrese de hacerle al mdico cualquier pregunta que tenga. Document Revised: 06/15/2017 Document Reviewed: 01/25/2017 Elsevier Patient Education  [place] oral tablets Qu es este medicamento? La SEMAGLUTIDA se [place] para mejorar el control de los niveles de Dispensing optician en adultos con diabetes tipo 2. Este [employer] se puede usar con otros medicamentos para la diabetes. Este medicamento puede ser utilizado para otros usos; si tiene alguna pregunta consulte con su proveedor de atencin mdica o con su farmacutico. MARCAS COMUNES: Rybelsus Qu le debo informar a mi profesional de la salud antes de tomar este medicamento? Necesitan saber si usted presenta alguno de los [employer] o situaciones: tumores endocrinos (neoplasia endcrina mltiple tipo II) o si alguien en su familiar tuvo esos tumores enfermedad ocular, problemas de la visin antecedentes de pancreatitis enfermedad renal problemas estomacales cncer de tiroides o si alguien en su familia tuvo cncer de tiroides una reaccin alrgica o inusual a la semaglutida, a otros medicamentos, alimentos, colorantes o conservantes si est embarazada o buscando quedar embarazada si est amamantando a un beb Cmo debo utilizar este medicamento? Tome este medicamento por va oral con un vaso de menos de 4 onzas de agua sin gas (menos de 120 mL). Siga las instrucciones de la etiqueta del [place]. No corte, triture ni [employer]. Aristes tabletas enteras. [employer] medicamento al menos 30 minutos antes de su primera comida, otra bebida u otro medicamento por va oral del da. Use su medicamento a  intervalos regulares. No use su medicamento con una frecuencia mayor a la indicada. No deje de usarlo, excepto si as lo indica su mdico. Su farmacutico le dar una [place] del medicamento especial (MedGuide, nombre en ingls) con cada receta y en cada ocasin que la vuelva a surtir. Asegrese de leer esta informacin cada vez cuidadosamente. Hable con su pediatra para informarse acerca del uso de este medicamento en nios. Puede requerir atencin especial. Sobredosis: Pngase en contacto inmediatamente con un centro toxicolgico o una sala de urgencia si usted cree que haya tomado demasiado medicamento. ATENCIN: [employer] es solo para usted. No comparta este medicamento con nadie. Qu sucede si me olvido de una dosis? Si se olvida de tomar una dosis, saltee la dosis [place]. Administre la prxima dosis a la hora habitual. No administre una dosis extra ni 2 dosis a la vez para compensar las dosis que olvid. Qu puede interactuar con este medicamento? Qu puede interactuar con este medicamento? aminofilina carbamazepina ciclosporina digoxina levotiroxina otros medicamentos para la diabetes fenitona tacrolims teofilina warfarina Muchos medicamentos pueden causar cambios en el nivel de azcar en la sangre. Estos incluyen: bebidas con alcohol medicamentos antivirales para el VIH o SIDA aspirina y medicamentos tipo aspirina ciertos medicamentos para la presin sangunea, enfermedad cardiaca y ritmo cardiaco irregular cromo diurticos hormonas femeninas, tales como estrgenos o progestinas, pldoras anticonceptivas fenofibrato gemfibrozil isoniazida lanreotida hormonas masculinas o esteroides anablicos IMAO, tales como Carbex, Eldepryl, Marplan, Nardil y Parnate medicamentos para  bajar de peso medicamentos para alergias, asma, resfros o tos medicamentos para la depresin, ansiedad o trastornos psicticos niacina nicotina AINE, medicamentos para Conservation officer, historic buildings y la inflamacin, tales como ibuprofeno o  naproxeno octreotida pasireotida pentamidina fenitona probenecida antibiticos del grupo de las quinolonas, tales como ciprofloxacino, levofloxacino y ofloxacino algunos suplementos dietticos a base de hierbas medicamentos esteroideos, tales como la prednisona o la cortisona sulfametoxasol; trimetoprima hormonas tiroideas Algunos medicamentos pueden ocultar los sntomas de advertencia de niveles bajos de azcar en la sangre (hipoglucemia). Es posible que deba monitorear ms atentamente su nivel de azcar en la sangre si est tomando uno de estos medicamentos. Estos medicamentos incluyen: betabloqueadores, que con frecuencia se usan para la presin sangunea alta o problemas cardiacos (algunos ejemplos son atenolol, metoprolol y propranolol) clonidina guanetidina reserpina Puede ser que esta lista no menciona todas las posibles interacciones. Informe a su profesional de [employer]	[place] de [employer] productos a base de hierbas, medicamentos de [place] o suplementos nutritivos que est tomando. Si usted fuma, consume bebidas alcohlicas o si utiliza drogas ilegales, indqueselo tambin a su profesional de [employer]	[place]. Algunas sustancias pueden interactuar con su medicamento. A qu debo estar atento al usar Coca-Cola? Visite a su mdico o a su profesional de la salud para que revise regularmente su evolucin. Beba abundante cantidad de lquidos mientras toma este medicamento. Consulte con su mdico o su profesional de la salud si tiene un ataque de diarrea grave, nuseas y vmitos. La prdida de demasiado lquido corporal puede hacer que sea peligroso usar Coca-Cola. Se monitorizar una prueba llamada Hemoglobina A1C (A1C). Es un anlisis de sangre sencillo. Mide su control del nivel de azcar en la sangre durante los ltimos 2 a 3 meses. Se le realizar esta prueba cada 3 a 6 meses. Aprenda a revisar su nivel de azcar en la sangre. Conozca los sntomas del nivel bajo y alto de Location manager en la sangre y  cmo controlarlos. Siempre lleve con usted una fuente rpida de azcar por si tiene sntomas de nivel bajo de azcar [employer]. Algunos ejemplos incluyen caramelos duros de azcar o tabletas de glucosa. Asegrese de que otras personas sepan que usted se puede ahogar si come o bebe cuando presenta sntomas graves de [place] bajo de azcar en la sangre, como convulsiones o prdida del conocimiento. Deben obtener ayuda mdica de inmediato. Informe a su mdico o profesional de la salud si tiene niveles altos de Dispensing optician. Es posible que deba cambiar la dosis de su medicamento. Si est enfermo o hace ms ejercicio que lo habitual, es posible que necesite cambiar la dosis de su medicamento. No saltee comidas. Pregunte a su mdico o profesional de la salud si debe evitar el alcohol. Muchos productos de venta libre para la tos y el resfro contienen azcar o alcohol. Estos pueden afectar los niveles de Dispensing optician. Use un brazalete o una cadena de identificacin mdica, y lleve con usted una tarjeta que describa su enfermedad, detalles de su medicamento y horario de dosis. No debe quedar embarazada mientras est [employer]. Las mujeres deben informar a su mdico si estn buscando quedar embarazadas o si creen que podran estar embarazadas. Existe la posibilidad de efectos secundarios graves en un beb sin nacer. Para obtener ms informacin, hable con su profesional de la salud o su farmacutico. No debe Economist a un beb mientras est usando este medicamento. Qu efectos secundarios puedo tener al [employer] este medicamento? Efectos secundarios que  debe informar a su mdico o a su profesional de la salud tan pronto como sea posible: Chief of Staff, como erupcin cutnea, comezn/picazn o urticaria, e hinchazn de la cara, los labios o la lengua problemas para respirar cambios en la visin diarrea que contina o es grave bulto o hinchazn en el cuello nuseas graves signos y  sntomas de infeccin, tales como fiebre o escalofros, tos, Social research officer, government de Investment banker, operational, Social research officer, government o dificultad para Garment/textile technologist signos y sntomas de niveles bajos de Dispensing optician, tales como ansiedad, confusin, Tree surgeon, aumento del apetito, debilidad o cansancio inusuales, sudoracin, temblores, fro, irritabilidad, Social research officer, government de cabeza, visin borrosa, ritmo cardiaco rpido, prdida del conocimiento signos y sntomas de lesin al rin, tales como dificultad para orinar o cambios en la cantidad de orina problemas para tragar dolor o Tree surgeon estomacal inusual vmito Efectos secundarios que generalmente no requieren atencin mdica (debe informarlos a su mdico o a Barrister's clerk de la salud si persisten o si son molestos): estreimiento diarrea nuseas Higher education careers adviser Puede ser que esta lista no menciona todos los posibles efectos secundarios. Comunquese a su mdico por asesoramiento mdico [employer] Inc. Usted puede informar los efectos secundarios a la FDA por telfono al 1-800-FDA-1088. Dnde debo guardar mi medicina? Mantenga fuera del alcance de los nios. Guarde a [employer], entre 15 y 18 grados Celsius (70 y 55 grados Fahrenheit). Mantenga este medicamento en el paquete blster original [employer] momento de usarlo. Guarde en un lugar seco. Deseche todo el medicamento que no haya utilizado despus de la fecha de vencimiento. ATENCIN: Este folleto es un resumen. Puede ser que no cubra toda la posible informacin. Si usted tiene preguntas acerca de esta medicina, consulte con su mdico, su farmacutico o su profesional de Technical sales engineer.  2020 Elsevier/Gold Standard (2018-09-12 00:00:00)  Semaglutide oral tablets/Rybelsus What is this medicine? SEMAGLUTIDE (Sem a GLOO tide) is used to improve blood sugar control in adults with type 2 diabetes. This medicine may be used with other diabetes medicines. This medicine may be used for other purposes; ask your health care provider or  pharmacist if you have questions. COMMON BRAND NAME(S): Rybelsus What should I tell my health care provider before I take this medicine? They need to know if you have any of these conditions:  endocrine tumors (MEN 2) or if someone in your family had these tumors  eye disease, vision problems  history of pancreatitis  kidney disease  stomach problems  thyroid cancer or if someone in your family had thyroid cancer  an unusual or allergic reaction to semaglutide, other medicines, foods, dyes, or preservatives  pregnant or trying to get pregnant  breast-feeding How should I use this medicine? Take this medicine by mouth with a glass of plain water that is less than 4 ounces (less than 120 mL). Follow the directions on the prescription label. Do not cut, crush or chew this medicine. Swallow the tablets whole. Take at least 30 minutes before the first food, other beverage, or other oral medications of the day. Take your medicine at regular intervals. Do not take your medicine more often than directed. Do not stop taking except on your doctor's advice. A special MedGuide will be given to you by the pharmacist with each prescription and refill. Be sure to read this information carefully each time. Talk to your pediatrician regarding the use of this medicine in children. Special care may be needed. Overdosage: If you think you have taken too much of this medicine contact a  poison control center or emergency room at once. NOTE: This medicine is only for you. Do not share this medicine with others. What if I miss a dose? If you miss a dose, skip it. Take your next dose at the normal time. Do not take extra or 2 doses at the same time to make up for the missed dose. What may interact with this medicine? What may interact with this medicine?  aminophylline  carbamazepine  cyclosporine  digoxin  levothyroxine  other medicines for  diabetes  phenytoin  tacrolimus  theophylline  warfarin Many medications may cause changes in blood sugar, these include:  alcohol containing beverages  antiviral medicines for HIV or AIDS  aspirin and aspirin-like drugs  certain medicines for blood pressure, heart disease, irregular heart beat  chromium  diuretics  male hormones, such as estrogens or progestins, birth control pills  fenofibrate  gemfibrozil  isoniazid  lanreotide  male hormones or anabolic steroids  MAOIs like Carbex, Eldepryl, Marplan, Nardil, and Parnate  medicines for weight loss  medicines for allergies, asthma, cold, or cough  medicines for depression, anxiety, or psychotic disturbances  niacin  nicotine  NSAIDs, medicines for pain and inflammation, like ibuprofen or naproxen  octreotide  pasireotide  pentamidine  phenytoin  probenecid  quinolone antibiotics such as ciprofloxacin, levofloxacin, ofloxacin  some herbal dietary supplements  steroid medicines such as prednisone or cortisone  sulfamethoxazole; trimethoprim  thyroid hormones Some medications can hide the warning symptoms of low blood sugar (hypoglycemia). You may need to monitor your blood sugar more closely if you are taking one of these medications. These include:  beta-blockers, often used for high blood pressure or heart problems (examples include atenolol, metoprolol, propranolol)  clonidine  guanethidine  reserpine This list may not describe all possible interactions. Give your health care provider a list of all the medicines, herbs, non-prescription drugs, or dietary supplements you use. Also tell them if you smoke, drink alcohol, or use illegal drugs. Some items may interact with your medicine. What should I watch for while using this medicine? Visit your doctor or health care professional for regular checks on your progress. Drink plenty of fluids while taking this medicine. Check with your  doctor or health care professional if you get an attack of severe diarrhea, nausea, and vomiting. The loss of too much body fluid can make it dangerous for you to take this medicine. A test called the HbA1C (A1C) will be monitored. This is a simple blood test. It measures your blood sugar control over the last 2 to 3 months. You will receive this test every 3 to 6 months. Learn how to check your blood sugar. Learn the symptoms of low and high blood sugar and how to manage them. Always carry a quick-source of sugar with you in case you have symptoms of low blood sugar. Examples include hard sugar candy or glucose tablets. Make sure others know that you can choke if you eat or drink when you develop serious symptoms of low blood sugar, such as seizures or unconsciousness. They must get medical help at once. Tell your doctor or health care professional if you have high blood sugar. You might need to change the dose of your medicine. If you are sick or exercising more than usual, you might need to change the dose of your medicine. Do not skip meals. Ask your doctor or health care professional if you should avoid alcohol. Many nonprescription cough and cold products contain sugar or alcohol. These can  affect blood sugar. Wear a medical ID bracelet or chain, and carry a card that describes your disease and details of your medicine and dosage times. Do not become pregnant while taking this medicine. Women should inform their doctor if they wish to become pregnant or think they might be pregnant. There is a potential for serious side effects to an unborn child. Talk to your health care professional or pharmacist for more information. Do not breast-feed an infant while taking this medicine. What side effects may I notice from receiving this medicine? Side effects that you should report to your doctor or health care professional as soon as possible:  allergic reactions like skin rash, itching or hives, swelling of  the face, lips, or tongue  breathing problems  changes in vision  diarrhea that continues or is severe  lump or swelling on the neck  severe nausea  signs and symptoms of infection like fever or chills; cough; sore throat; pain or trouble passing urine  signs and symptoms of low blood sugar such as feeling anxious, confusion, dizziness, increased hunger, unusually weak or tired, sweating, shakiness, cold, irritable, headache, blurred vision, fast heartbeat, loss of consciousness  signs and symptoms of kidney injury like trouble passing urine or change in the amount of urine  trouble swallowing  unusual stomach upset or pain  vomiting Side effects that usually do not require medical attention (report these to your doctor or health care professional if they continue or are bothersome):  constipation  diarrhea  nausea  stomach upset This list may not describe all possible side effects. Call your doctor for medical advice about side effects. You may report side effects to FDA at 1-800-FDA-1088. Where should I keep my medicine? Keep out of the reach of children. Store at room temperature between 15 and 30 degrees C (59 and 86 degrees F). Keep this medicine in the original blister card until use. Keep in a dry place. Throw away any unused medicine after the expiration date. NOTE: This sheet is a summary. It may not cover all possible information. If you have questions about this medicine, talk to your doctor, pharmacist, or health care provider.  2020 Elsevier/Gold Standard (2018-07-12 10:07:43)   Venas varicosas Varicose Veins Las venas varicosas son venas que se han agrandado y [place], y se han tornado sinuosas. Suelen aparecer en las piernas. Cules son las causas? La causa de esta afeccin es el dao en las vlvulas de la vena. Estas vlvulas ayudan a que la sangre regrese al [employer]. Cuando estn daadas y dejan de funcionar correctamente, la sangre puede ir en  direccin inversa y Writer a las venas cerca de la piel, lo que hace que las venas se agranden y se vean sinuosas. La afeccin puede surgir por cualquier factor que haga que la sangre retorne, como un [place], una actividad que obligue a estar de pie de forma prolongada o la obesidad. Qu incrementa el riesgo? Es ms probable que Orthoptist en las personas con estas caractersticas:  Paramedic de Academic librarian.  Estn embarazadas.  Tienen sobrepeso. Cules son los signos o los sntomas? Los sntomas de esta afeccin incluyen:  Venas abultadas, sinuosas y [place].  Sensacin de pesadez. Estos sntomas pueden empeorar hacia el final del da.  Dolor en las piernas. Estos sntomas pueden empeorar hacia el final del da.  Hinchazn en la pierna.  Cambios en el color de la piel que est [employer]. Cmo se diagnostica? Esta afeccin se puede  diagnosticar en funcin de los sntomas, un examen fsico y [place]. Cmo se trata? El tratamiento de esta afeccin puede incluir lo siguiente:  Clinical cytogeneticist sentado o de pie en la misma posicin durante mucho tiempo.  Usar medias de compresin. Estas medias ayudan a Mining engineer formacin de cogulos de [place] y a reducir la hinchazn de las piernas.  Levantar (elevar) las piernas al descansar.  Bajar de [place].  Hacer actividad fsica con regularidad. Si tiene sntomas persistentes o desea mejorar el aspecto de las venas varicosas, puede optar por un procedimiento para anular dichas venas o extraerlas. Entre los tratamientos para anular las venas, se incluyen los siguientes:  Public affairs consultant. En este tratamiento, se inyecta una solucin en la vena para anularla.  Tratamiento con lser. En este tratamiento, la vena se calienta con un lser para anularla.  Ablacin venosa por radiofrecuencia. En este tratamiento, se [place] una corriente elctrica que se produce mediante ondas de radio para anular la vena. Entre  los tratamientos para extraer las venas, se incluyen los siguientes:  Flebectoma. En [employer], las venas se extraen a travs de pequeas incisiones que se realizan por encima de las venas.  Ligadura venosa y [place]. En este tratamiento, se realizan incisiones por encima de las venas. Luego, las venas se extraen despus de atarse (ligarse) con puntos (suturas). Siga estas instrucciones en su casa: Actividad  Camine todo lo que pueda. Caminar aumenta el flujo sanguneo. Esto ayuda a que la sangre regrese al corazn y Engineer, production la presin en las venas. Tambin aumenta la fuerza cardiovascular.  Siga las instrucciones del mdico con respecto al ejercicio.  No permanezca de pie o sentado en una misma posicin [employer].  No se siente con las piernas cruzadas.  Descanse con [place]. Instrucciones generales   Siga las instrucciones del mdico en lo que respecta a la dieta.  Use medias de compresin como se lo haya indicado su mdico. No use otra clase de vestimenta ajustada alrededor de las piernas, la pelvis o la cintura.  De noche, eleve las piernas a una altura superior al nivel del corazn.  Si se corta en la piel que est por encima de una vena varicosa y esta sangra: ? Recustese con la pierna levantada. ? Coloque un pao limpio sobre el corte y presione firmemente sobre l hasta que el sangrado se [place]. ? Aplique una venda (vendaje) sobre el corte. Comunquese con un mdico si:  La piel alrededor de las venas varicosas empieza a Medical illustrator.  Siente dolor o tiene enrojecimiento, sensibilidad o hinchazn dura sobre la vena.  Est incmodo debido al dolor.  Se corta en la piel de encima de una vena varicosa y no deja de Therapist, art. Resumen  Las venas varicosas son venas que se han agrandado y [place], y se han tornado sinuosas. Suelen aparecer en las piernas.  La causa de esta afeccin es el dao en las vlvulas de la vena.  Estas vlvulas ayudan a que la sangre regrese al [employer].  El tratamiento de esta afeccin incluye hacer movimientos frecuentes, usar medias de compresin, perder peso y Field seismologist ejercicios de forma regular. En algunos casos, se realizan procedimientos que anulan o [place] venas.  El tratamiento de esta afeccin puede incluir usar medias de compresin, [employer] piernas, perder peso y Optometrist actividades de forma regular. En algunos casos, se realizan procedimientos que anulan o [place] venas. Esta informacin no tiene Marine scientist el consejo del mdico.  Asegrese de hacerle al mdico cualquier pregunta que tenga. Document Revised: 12/28/2018 Document Reviewed: 12/28/2018 Elsevier Patient Education  [place].

## 2020-04-26 NOTE — Progress Notes (Signed)
Patient presenting with a rash starting where his left thigh and groin meet. Th rash is spreading to the groin area and his back. Patient states the rash does not itch nor is it painful.   Patient flagged: Current status:  PATIENT IS OVERDUE FOR BMI FOLLOW UP PLAN BMI is estimated to be 29.4 based on the last recorded weight and height

## 2020-09-20 ENCOUNTER — Telehealth (INDEPENDENT_AMBULATORY_CARE_PROVIDER_SITE_OTHER): Payer: Commercial Managed Care - PPO | Admitting: Internal Medicine

## 2020-09-20 ENCOUNTER — Encounter: Payer: Self-pay | Admitting: Internal Medicine

## 2020-09-20 ENCOUNTER — Other Ambulatory Visit: Payer: Self-pay

## 2020-09-20 VITALS — Ht 65.0 in | Wt 182.0 lb

## 2020-09-20 DIAGNOSIS — Z1389 Encounter for screening for other disorder: Secondary | ICD-10-CM | POA: Diagnosis not present

## 2020-09-20 DIAGNOSIS — Z1329 Encounter for screening for other suspected endocrine disorder: Secondary | ICD-10-CM

## 2020-09-20 DIAGNOSIS — E119 Type 2 diabetes mellitus without complications: Secondary | ICD-10-CM

## 2020-09-20 DIAGNOSIS — E559 Vitamin D deficiency, unspecified: Secondary | ICD-10-CM

## 2020-09-20 DIAGNOSIS — L309 Dermatitis, unspecified: Secondary | ICD-10-CM | POA: Diagnosis not present

## 2020-09-20 DIAGNOSIS — J321 Chronic frontal sinusitis: Secondary | ICD-10-CM

## 2020-09-20 DIAGNOSIS — Z13818 Encounter for screening for other digestive system disorders: Secondary | ICD-10-CM

## 2020-09-20 MED ORDER — SALINE SPRAY 0.65 % NA SOLN: 2.0000 | NASAL | 11 refills | Status: DC | PRN

## 2020-09-20 MED ORDER — CLOBETASOL PROPIONATE 0.05 % EX CREA: 1.0000 "application " | TOPICAL_CREAM | Freq: Two times a day (BID) | CUTANEOUS | 2 refills | Status: DC

## 2020-09-20 MED ORDER — AZITHROMYCIN 250 MG PO TABS: ORAL_TABLET | ORAL | 0 refills | Status: DC

## 2020-09-20 MED ORDER — FLUTICASONE PROPIONATE 50 MCG/ACT NA SUSP: 2.0000 | Freq: Every day | NASAL | 2 refills | Status: DC | PRN

## 2020-09-20 NOTE — Progress Notes (Signed)
Virtual Visit via Video Note  I connected with Bill Herrera  on 09/20/20 at  4:25 PM EST by a video enabled telemedicine application and verified that I am speaking with the correct person using two identifiers.  Location patient: home, [place] Location provider:work or home office Persons participating in the virtual visit: patient, provider  I discussed the limitations of evaluation and management by telemedicine and the availability of in person appointments. The patient expressed understanding and agreed to proceed.   HPI: 1. DM2 Rybelsus 3 mg qd is expensive $300/month and this is a bit much for him A1C 04/26/20 7.0   2. 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 He noted one day he was around a lot of dust and did not wear a mask. He also had sore throat resolved. He tried benaryl otc Q4 hours which is helping   No h/o asthma but his dad does   Denies fever, sob, sick contacts   3. Rash on arms and legs and stomach rash it itchy will Rx temovate  He has changed soaps recently due to forgetting his dove soap at home and using hotel soap -he is est [facility]  ROS: See pertinent positives and negatives per HPI.  Past Medical History:  Diagnosis Date  . Diabetes mellitus without complication ([place])   . Frequent headaches   . Lipoma    x4 removed with 12 more as of 09/22/2019    Past Surgical History:  Procedure Laterality Date  . right cyst removal     right forehead      Current Outpatient Medications:  .  clobetasol cream (TEMOVATE) 6.76 %, Apply 1 application topically 2 (two) times daily. Right arm abdomen, Disp: 60 g, Rfl: 2 .  glucose blood test strip, Use as instructed E11.9. Accu chek guid test strips check blood sugar in am before breakfast, after lunch  and after dinner, Disp: 300 each, Rfl: 12 .  Lancets (ACCU-CHEK SOFT TOUCH) lancets, E 11.9 Use as instructed check blood sugar 3x per day, Disp: 300 each, Rfl: 12 .  miconazole (MICATIN) 2 % cream, Apply 1 application topically 2 (two) times daily. To groin x 1-2 weeks as needed groin area/inner thigh, Disp: 60 g, Rfl: 11 .  azithromycin (ZITHROMAX) 250 MG tablet, 2 pills day 1 and 1 pill day 2-5 mg with food, Disp: 6 tablet, Rfl: 0 .  fluticasone (FLONASE) 50 MCG/ACT nasal spray, Place 2 sprays into both nostrils daily as needed for allergies or rhinitis. After nasal saline, Disp: 16 g, Rfl: 2 .  pravastatin (PRAVACHOL) 20 MG tablet, Take 1 tablet (20 mg total) by mouth at bedtime. (Patient not taking: Reported on 09/20/2020), Disp: 90 tablet, Rfl: 3 .  Semaglutide (RYBELSUS) 3 MG TABS, Take 1 tablet by mouth daily. (Patient not taking: Reported on 09/20/2020), Disp: 30 tablet, Rfl: 2 .  Semaglutide (RYBELSUS) 3 MG TABS, Take 1 tablet by mouth daily. Samples of this drug were given to the patient, quantity 1 box # 30, Lot Number [id number] exp 04/2022, Disp: 30 tablet, Rfl: 0 .  sodium chloride (OCEAN) 0.65 % SOLN nasal spray, Place 2 sprays into both nostrils as needed for congestion., Disp: 30 mL, Rfl: 11  EXAM:  VITALS per patient if applicable:  GENERAL: alert,  oriented, appears well and in no acute distress  HEENT: atraumatic, conjunttiva clear, no obvious abnormalities on inspection of external nose and ears  NECK: normal movements of the head and neck  LUNGS: on inspection no signs of respiratory distress, breathing rate appears normal, no obvious gross SOB, gasping or wheezing  CV: no obvious cyanosis  MS: moves all visible extremities without noticeable abnormality  PSYCH/NEURO: pleasant and cooperative, no obvious depression or anxiety, speech and thought processing grossly intact  SKIN: erythematous hyperpig rash to abdomen, legs arms  ASSESSMENT AND PLAN:  Discussed the  following assessment and plan:  Frontal sinusitis, unspecified chronicity - Plan: azithromycin (ZITHROMAX) 250 MG tablet, sodium chloride (OCEAN) 0.65 % SOLN nasal spray, fluticasone (FLONASE) 50 MCG/ACT nasal spray  Dermatitis - Plan: clobetasol cream (TEMOVATE) 0.05 %, CBC w/Diff Est with [facility] if rash not better Dr. Elba Barman   Type 2 diabetes mellitus without complication, without long-term current use of insulin ([place]) - Plan: Semaglutide (RYBELSUS) 3 MG TABS sample given and 4 boxes left, Ambulatory referral to [facility] Care [facility] Services, Comprehensive metabolic panel, Lipid panel, CBC w/Diff, Urinalysis, Routine w reflex microscopic, Hemoglobin A1c, Microalbumin / creatinine urine ratio, Glutamic acid decarboxylase auto abs Glutamic acid decarboxylase auto abs Refer to catie help A1C control, and cost of meds  Consider pna 23 vaccine  Consider eye and foot exam in future  HM Fluutd sch fasting labs12/2021 tdap utd moderna 2/2 consider booster  Consider pna 23 vaccine Consider hep B vaccine new x 2 doses hep A immune;h/o fatty liver  HIV negative  rec healthy diet and exercise given info   -we discussed possible serious and likely etiologies, options for evaluation and workup, limitations of telemedicine visit vs in person visit, treatment, treatment risks and precautions.    I discussed the assessment and treatment plan with the patient. The patient was provided an opportunity to ask questions and all were answered. The patient agreed with the plan and demonstrated an understanding of the instructions.    Time spent 30 minutes Delorise Jackson, MD

## 2020-09-20 NOTE — Progress Notes (Signed)
Rybelsus copay is $400.   Patient would like to discuss allergy symptoms.  Sore throat and Break outs on legs, stomach, and arms. Started last Monday. Patient states only change was working outside and used different soap.

## 2020-09-20 NOTE — Patient Instructions (Addendum)
Nasal saline 2 sprays each side of your nose as needed  Flonase 1-2 sprays as needed  Cetaphil or Cerave Cream put on after showering    Rinitis alrgica en adultos Allergic Rhinitis, Adult La rinitis alrgica es una reaccin alrgica que afecta la membrana mucosa que se encuentra en la nariz. Provoca estornudos, goteo o congestin nasal, y la sensacin de que baja mucosidad por la parte trasera de la garganta(goteo posnasal). La rinitis alrgica puede ser de leve a grave. [employer] tipos de rinitis alrgica:  Psychologist, counselling. Este tipo tambin se denomina fiebre del heno. Sucede nicamente durante algunas estaciones.  Perenne. Este tipo puede ocurrir en cualquier momento del ao. Cules son las causas? Esta afeccin ocurre cuando el sistema de defensa del cuerpo(sistema inmunitario) reacciona a ciertas sustancias inofensivas llamadas alrgenos como si fueran grmenes.  La rinitis alrgica estacional se desencadena por el polen, que puede provenir del csped, los rboles y [employer]. La rinitis alrgica perenne puede ser causada por:  Los caros del polvo en Engineer, mining.  La caspa de las Wiggins.  Esporas del moho. Cules son los signos o los sntomas? Los sntomas de esta afeccin incluyen lo siguiente:  Estornudos.  Nariz tapada o que gotea (congestin nasal).  Goteo posnasal.  Escozor en la Doran Durand.  Ojos llorosos.  Dificultad para dormir.  [place]. Cmo se diagnostica? Esta afeccin se puede diagnosticar en funcin de lo siguiente:  Sus antecedentes mdicos.  Un examen fsico.  Estudios para detectar afecciones relacionadas, como las siguientes: ? Asma. ? Ojo rojo. ? Infeccin en los odos. ? Infeccin de las vas respiratorias superiores.  Estudios para Development worker, international aid sus sntomas. Por ejemplo, anlisis de sangre y cutneos. Cmo se trata? No hay cura para esta afeccin, pero el tratamiento puede ayudar a [employer]  sntomas. El tratamiento puede incluir lo siguiente:  Tomar medicamentos que [employer] sntomas de la [place], [place] antihistamnicos. Medicamentos que pueden administrarse por inyeccin, aerosol nasal o pldoras.  Evitar el alrgeno.  Desensibilizacin. Este tratamiento implica que se le apliquen inyecciones continuas hasta que su cuerpo se vuelva menos sensible al alrgeno. Este tratamiento se puede realizar si otros tratamientos no son eficaces.  Si tomar medicamentos y English as a second language teacher alrgeno no funciona, se pueden recetar nuevos medicamentos ms fuertes. Siga estas indicaciones en su casa:  Conozca a qu es Air cabin crew. Los alrgenos comunes incluyen el humo, polvo y [place].  Evite las cosas a las cuales es alrgico. Hay medidas que puede tomar para ayudar a Product/process development scientist los alrgenos: ? [employer] alfombras por pisos de [place], baldosas o vinilo. Las alfombras pueden retener la caspa de los animales y [place]. ? No fume. No permita que fumen en su casa. ? Cambie el filtro de la calefaccin y del aire acondicionado al menos una vez al mes. ? Durante la temporada de alergias:  Mantenga las ventanas cerradas la mayor cantidad de [place] posible.  Planee actividades al aire libre cuando las concentraciones de polen estn en su nivel ms bajo. Normalmente, esto es [employer] de noche.  Cuando ingrese al interior, [place] de ropa y dese una ducha antes de sentarse en los muebles o la cama.  Tome los medicamentos de venta libre y los recetados solamente como se lo haya indicado el mdico.  Consulting civil engineer a todas las visitas de seguimiento como se lo haya indicado el mdico. Esto es importante. Comunquese con un mdico si:  Tiene fiebre.  Tiene tos persistente.  Comienza a emitir un sonido agudo al respirar (sibilancia).  Sus sntomas interfieren con sus actividades diarias normales. Solicite ayuda de inmediato si:  Le falta el aire. Resumen  Esta afeccin puede controlarse al  tomar [employer] como se le indique y al Management consultant.  Comunquese con su mdico si tiene tos persistente o fiebre.  Durante la temporada de [place], [place]arcissa las ventanas cerradas la mayor cantidad de [place] posible. Esta informacin no tiene Marine scientist el consejo del mdico. Asegrese de hacerle al mdico cualquier pregunta que tenga. Document Revised: 01/20/2017 Document Reviewed: 01/20/2017 Elsevier Patient Education  [place].  Allergic Rhinitis, Adult Allergic rhinitis is an allergic reaction that affects the mucous membrane inside the nose. It causes sneezing, a runny or stuffy nose, and the feeling of mucus going down the back of the throat (postnasal drip). Allergic rhinitis can be mild to severe. There are two types of allergic rhinitis:  Seasonal. This type is also called hay fever. It happens only during certain seasons.  Perennial. This type can happen at any time of the year. What are the causes? This condition happens when the body's defense system (immune system) responds to certain harmless substances called allergens as though they were germs.  Seasonal allergic rhinitis is triggered by pollen, which can come from grasses, trees, and weeds. Perennial allergic rhinitis may be caused by:  House dust mites.  Pet dander.  Mold spores. What are the signs or symptoms? Symptoms of this condition include:  Sneezing.  Runny or stuffy nose (nasal congestion).  Postnasal drip.  Itchy nose.  Tearing of the eyes.  Trouble sleeping.  Daytime sleepiness. How is this diagnosed? This condition may be diagnosed based on:  Your medical history.  A physical exam.  Tests to check for related conditions, such as: ? Asthma. ? Pink eye. ? Ear infection. ? Upper respiratory infection.  Tests to find out which allergens trigger your symptoms. These may include skin or blood tests. How is this treated? There is no cure for this condition,  but treatment can help control symptoms. Treatment may include:  Taking medicines that block allergy symptoms, such as antihistamines. Medicine may be given as a shot, nasal spray, or pill.  Avoiding the allergen.  Desensitization. This treatment involves getting ongoing shots until your body becomes less sensitive to the allergen. This treatment may be done if other treatments do not help.  If taking medicine and avoiding the allergen does not work, new, stronger medicines may be prescribed. Follow these instructions at home:  Find out what you are allergic to. Common allergens include smoke, dust, and pollen.  Avoid the things you are allergic to. These are some things you can do to help avoid allergens: ? Replace carpet with wood, tile, or vinyl flooring. Carpet can trap dander and dust. ? Do not smoke. Do not allow smoking in your home. ? Change your heating and air conditioning filter at least once a month. ? During allergy season:  Keep windows closed as much as possible.  Plan outdoor activities when pollen counts are lowest. This is usually during the evening hours.  When coming indoors, change clothing and shower before sitting on furniture or bedding.  Take over-the-counter and prescription medicines only as told by your health care provider.  Keep all follow-up visits as told by your health care provider. This is important. Contact a health care provider if:  You have a fever.  You develop a persistent cough.  You  make whistling sounds when you breathe (you wheeze).  Your symptoms interfere with your normal daily activities. Get help right away if:  You have shortness of breath. Summary  This condition can be managed by taking medicines as directed and avoiding allergens.  Contact your health care provider if you develop a persistent cough or fever.  During allergy season, keep windows closed as much as possible. This information is not intended to replace  advice given to you by your health care provider. Make sure you discuss any questions you have with your health care provider. Document Revised: 09/17/2017 Document Reviewed: 11/12/2016 Elsevier Patient Education  [place].   Sinusitis, en adultos Sinusitis, Adult La sinusitis es la inflamacin de los senos paranasales. Los senos paranasales son espacios vacos en los huesos alrededor del rostro. Los senos paranasales se encuentran en estos lugares:  Alrededor de los ojos.  En la mitad de la frente.  Detrs de Mudlogger.  En los pmulos. Normalmente, la mucosidad drena a travs de los senos. Cuando los tejidos nasales se inflaman o hinchan, la mucosidad puede quedar atrapada o bloqueada. Esto fomenta la proliferacin de bacterias, virus y hongos, lo que produce infecciones. La mayora de las infecciones de los senos paranasales son provocadas por un virus. La sinusitis puede desarrollarse rpidamente. Puede durar Waylan Boga 4semanas (aguda) o ms de 12semanas (crnica). A menudo, la sinusitis surge despus de un resfriado. Cules son las causas? Esta afeccin es causada por cualquier sustancia que inflame los senos o evite que la mucosidad drene. Esto puede comprender lo siguiente:  Alergias.  Asma.  Infeccin por bacterias o virus.  Deformidades u obstrucciones en la nariz o los senos paranasales.  Crecimientos anormales en la nariz (plipos nasales).  Agentes contaminantes, como sustancias qumicas o irritantes presentes en el aire.  Infeccin por hongos (poco frecuentes). Qu incrementa el riesgo? Es ms probable que tenga esta afeccin si:  Tienen debilitado el sistema de defensa del organismo (sistema inmunitario).  Nada o bucea mucho.  Abusa de los [employer]onic.  Fuma. Cules son los signos o los sntomas? Los principales sntomas de esta afeccin son dolor y sensacin de presin alrededor de los senos paranasales afectados. Otros sntomas pueden  incluir los siguientes:  Nariz tapada o congestin nasal.  Drenaje de mucosidad espesa que sale de la Lawyer.  Hinchazn y calor en los senos paranasales afectados.  Dolor de [place].  Dolor en los dientes superiores.  Tos que puede empeorar por la noche.  Mucosidad excesiva que se acumula en la garganta o la parte posterior de la nariz (goteo posnasal).  Disminucin del sentido del olfato y del gusto.  Fatiga.  Cristy Hilts.  Dolor de Investment banker, operational.  Mal aliento. Cmo se diagnostica? Esta afeccin se diagnostica en funcin de lo siguiente:  Sus sntomas.  Sus antecedentes mdicos.  Un examen fsico.  Pruebas para averiguar si la afeccin es [place] o crnica. Estas pueden incluir: ? Revisar la nariz para ver si tiene plipos nasales. ? Observar los senos paranasales con un dispositivo que tiene una luz (endoscopio). ? Hacer pruebas para detectar alergias o bacterias. ? Pruebas de diagnstico por imgenes, como Health visitor (RM) o una exploracin por tomografa computarizada (TC). En contadas ocasiones, se puede realizar una biopsia de hueso para descartar tipos ms graves de infecciones por hongos en los senos paranasales. Cmo se trata? El tratamiento para la sinusitis depende de la causa y de si la afeccin es [place].  Si la causa es un  virus, los sntomas deberan desaparecer solos en el trmino de 10das. Pueden darle medicamentos para aliviar los sntomas. Entre ellos, se incluyen los siguientes: ? Medicamentos para encoger las fosas nasales hinchadas (descongestivos intranasales tpicos). ? Medicamentos para tratar alergias (antihistamnicos). ? Un aerosol que [employer] inflamacin de las fosas nasales (corticoesteroide intranasal tpico). ? Enjuagues que ayudan a eliminar la mucosidad espesa de la nariz (lavados con solucin salina nasal).  Si la causa son bacterias, el mdico puede recomendarle que espere para ver si los sntomas mejoran. La mayora de  las infecciones bacterianas mejoran sin medicamentos antibiticos. Posiblemente le den antibiticos si usted tiene: ? Una infeccin grave. ? El sistema inmunitario debilitado.  Si la causa es un estrechamiento de las fosas nasales o la presencia de plipos nasales, es posible que necesite [place]. Siga estas indicaciones en su casa: [employer], use o aplquese los medicamentos de venta libre y Editor, commissioning como se lo haya indicado el mdico. Estos pueden incluir aerosoles nasales.  Si le recetaron un antibitico, tmelo como se lo haya indicado el mdico. No deje de tomar los antibiticos aunque comience a Sports administrator. Hidrtese y humidifique los ambientes   Beba suficiente lquido como para Theatre manager la orina de color amarillo plido. Mantenerse hidratado lo ayudar a [employer].  Use un humidificador de vapor fro para mantener la humedad de su hogar por encima del 50%.  Realice inhalaciones de vapor por 10 a 99minutos, de 3 a 4veces al da, o como se lo haya indicado el mdico. Puede hacer esto en el bao con el vapor del agua caliente de la ducha.  Limite la exposicin al aire fro o seco. Reposo  Descanse todo lo que pueda.  Duerma con la cabeza levantada (elevada).  Asegrese de dormir lo suficiente cada noche. Indicaciones generales   Aplquese un pao tibio y hmedo en la cara 3 o 4veces al da o como se lo haya indicado el mdico. Esto ayuda a Actor las [place].  Lvese las manos frecuentemente con agua y jabn para reducir la exposicin a los grmenes. Use desinfectante para manos si no dispone de [place] y [place].  No fume. Evite estar cerca de personas que fuman (fumador pasivo).  Concurra a todas las visitas de seguimiento como se lo haya indicado el mdico. Esto es importante. Comunquese con un mdico si:  Tiene fiebre.  Sus sntomas empeoran.  Los sntomas no mejoran en el trmino de 10das. Solicite ayuda inmediatamente  si:  Tiene un dolor de cabeza intenso.  Tiene vmitos persistentes.  Tiene dolor intenso o hinchazn en la zona del rostro o los ojos.  Tiene problemas de visin.  Presenta confusin.  Tiene el cuello rgido.  Tiene dificultad para respirar. Resumen  La sinusitis es Conservation officer, historic buildings y la inflamacin de los senos paranasales. Los senos paranasales son espacios vacos en los huesos alrededor del rostro.  La causa de esta afeccin es la inflamacin o hinchazn de los tejidos [place]. La hinchazn atrapa u obstruye el flujo de la mucosidad. Esto fomenta la proliferacin de bacterias, virus y hongos, lo que produce infecciones.  Si le recetaron un antibitico, tmelo como se lo haya indicado el mdico. No deje de tomar los antibiticos, aunque comience a Sports administrator.  Concurra a todas las visitas de seguimiento como se lo haya indicado el mdico. Esto es importante. Esta informacin no tiene Marine scientist el consejo del mdico. Asegrese de hacerle al mdico cualquier pregunta que tenga. Document Revised: 04/19/2018  Document Reviewed: 04/19/2018 Elsevier Patient Education  [place] [employer].

## 2020-09-23 MED ORDER — RYBELSUS 3 MG PO TABS: 1.0000 | ORAL_TABLET | Freq: Every day | ORAL | 0 refills | Status: DC

## 2020-09-24 ENCOUNTER — Telehealth: Payer: Self-pay

## 2020-09-24 NOTE — Chronic Care Management (AMB) (Signed)
  Care Management   Note  09/24/2020 Name: Bill Herrera MRN: [id number] DOB: 1981/04/14  Bill Herrera is a 39 y.o. year old male who is a primary care patient of Bill Herrera, Bill Glow, MD. I reached out to Bill Herrera by phone today in response to a referral sent by Bill Herrera Bill Mustard, MD.    Bill Herrera was given information about care management services today including:  1. Care management services include personalized support from designated clinical staff supervised by his physician, including individualized plan of care and coordination with other care providers 2. 24/7 contact phone numbers for assistance for urgent and routine care needs. 3. The patient may stop care management services at any time by phone call to the office staff.  Patient agreed to services and verbal consent obtained.   Follow up plan: Telephone appointment with care management team member scheduled for:09/25/2020  Bill Herrera, [place], Bill Herrera, Bill Herrera [id number] Direct Dial: [phone] [email or web address] Website: [email or web address]

## 2020-09-25 ENCOUNTER — Telehealth: Payer: Self-pay | Admitting: Internal Medicine

## 2020-09-25 ENCOUNTER — Telehealth: Payer: Commercial Managed Care - PPO

## 2020-09-25 NOTE — Telephone Encounter (Signed)
Patient called and wanted to reschedule appointment that he missed this morning.

## 2020-09-27 ENCOUNTER — Ambulatory Visit: Payer: Commercial Managed Care - PPO | Admitting: Pharmacist

## 2020-09-27 DIAGNOSIS — E785 Hyperlipidemia, unspecified: Secondary | ICD-10-CM

## 2020-09-27 DIAGNOSIS — E1165 Type 2 diabetes mellitus with hyperglycemia: Secondary | ICD-10-CM

## 2020-09-27 MED ORDER — PRAVASTATIN SODIUM 20 MG PO TABS: 20.0000 mg | ORAL_TABLET | Freq: Every day | ORAL | 3 refills | Status: DC

## 2020-09-27 NOTE — Telephone Encounter (Signed)
Spoke with patient this morning. See CCM documentation

## 2020-09-27 NOTE — Chronic Care Management (AMB) (Signed)
Care Management   Pharmacy Note  09/27/2020 Name: Bill Herrera MRN: [id number] DOB: [birth year]   Subjective:  Bill Herrera is a 39 y.o. year old male who is a primary care patient of McLean-Scocuzza, Nino Glow, MD. The Care Management/Care Coordination team team was consulted for assistance with care management and care coordination needs.    Engaged with patient by telephone for initial visit in response to provider referral for pharmacy case management and/or care coordination services.   Consent to Services:  Bill Herrera was given the following information about care management and care coordination services today, agreed to services, and gave verbal consent: 1.care management/care coordination services include personalized support from designated clinical staff supervised by his physician, including individualized plan of care and coordination with other care providers 2. 24/7 contact phone numbers for assistance for urgent and routine care needs. 3. The patient may stop care management/care coordination services at any time by phone call to the office staff.  Review of patient status, including review of consultants reports, laboratory and other test data, was performed as part of comprehensive evaluation and provision of chronic care management services.   SDOH (Social Determinants of Health) assessments and interventions performed:  SDOH Interventions   Flowsheet Row Most Recent Value  SDOH Interventions   Financial Strain Interventions Other (Comment)  [manufacturer coupon]       Objective:  Lab Results  Component Value Date   CREATININE 0.88 01/12/2020   CREATININE 0.82 09/29/2019   CREATININE 0.79 04/07/2019    Lab Results  Component Value Date   HGBA1C 7.0 (A) 04/26/2020       Component Value Date/Time   CHOL 177 01/12/2020 0931   TRIG 266.0 (H) 01/12/2020 0931   HDL 28.50 (L) 01/12/2020 0931   CHOLHDL 6 01/12/2020 0931   VLDL 53.2 (H) 01/12/2020 0931   LDLCALC  124 (H) 09/29/2019 0821   LDLDIRECT 102.0 01/12/2020 0931     BP Readings from Last 3 Encounters:  04/26/20 118/76  06/30/19 114/73  06/02/19 118/76    Assessment:   No Known Allergies  Medications Reviewed Today    Reviewed by De Hollingshead, RPH-CPP (Pharmacist) on 09/27/20 at [phone]  Med List Status: <None>  Medication Order Taking? Sig Documenting Provider Last Dose Status Informant  azithromycin (ZITHROMAX) 250 MG tablet [id number] Yes 2 pills day 1 and 1 pill day 2-5 mg with food McLean-Scocuzza, Nino Glow, MD Taking Active   clobetasol cream (TEMOVATE) 0.05 % [id number] Yes Apply 1 application topically 2 (two) times daily. Right arm abdomen McLean-Scocuzza, Nino Glow, MD Taking Active   fluticasone [facility]) 50 MCG/ACT nasal spray [id number] Yes Place 2 sprays into both nostrils daily as needed for allergies or rhinitis. After nasal saline McLean-Scocuzza, Nino Glow, MD Taking Active   glucose blood test strip [id number] Yes Use as instructed E11.9. Accu chek guid test strips check blood sugar in am before breakfast, after lunch and after dinner McLean-Scocuzza, Nino Glow, MD Taking Active   Lancets (ACCU-CHEK SOFT TOUCH) lancets [id number] Yes E 11.9 Use as instructed check blood sugar 3x per day McLean-Scocuzza, Nino Glow, MD Taking Active   miconazole (MICATIN) 2 % cream [id number] Yes Apply 1 application topically 2 (two) times daily. To groin x 1-2 weeks as needed groin area/inner thigh McLean-Scocuzza, Nino Glow, MD Taking Active   pravastatin (PRAVACHOL) 20 MG tablet [id number] No Take 1 tablet (20 mg total) by mouth at bedtime.  Patient not taking: No sig reported   McLean-Scocuzza, Nino Glow,  MD Not Taking Active   Semaglutide (RYBELSUS) 3 MG TABS [id number] No Take 1 tablet by mouth daily.  Patient not taking: No sig reported   McLean-Scocuzza, Nino Glow, MD Not Taking Active   sodium chloride (OCEAN) 0.65 % SOLN nasal spray [id number] Yes Place 2 sprays into both nostrils as needed for  congestion. McLean-Scocuzza, Nino Glow, MD Taking Active           Patient Active Problem List   Diagnosis Date Noted  . Intertrigo 11/21/2019  . Annual physical exam 09/22/2019  . Fatty liver 04/13/2019  . Vitamin D deficiency 04/07/2019  . HLD (hyperlipidemia) 04/07/2019  . Elevated liver enzymes 09/23/2018  . Dermatitis 09/23/2018  . Tinea cruris 09/23/2018  . Type 2 diabetes mellitus without complication, without long-term current use of insulin ([place]) 09/23/2018    Medication Assistance: None required. Patient affirms current coverage meets needs.   Patient Care Plan: Medication Management    Problem Identified: Diabetes, Hyperlipidemia     Long-Range Goal: Disease Progression Prevention   This Visit's Progress: On track  Priority: High  Note:   Current Barriers:  . Unable to independently afford treatment regimen . Unable to achieve control of diabetes   Pharmacist Clinical Goal(s):  Marland Kitchen Over the next 90 days, patient will verbalize ability to afford treatment regimen. . Over the next 90 days, patient will achieve control of diabetes as evidenced by improvement in A1c through collaboration with PharmD and provider.   Interventions: . Inter-disciplinary care team collaboration (see longitudinal plan of care) . Comprehensive medication review performed; medication list updated in electronic medical record  Diabetes: . Uncontrolled; current treatment: prescribed Rybelsus 3 mg daily, but per patient discussion today, appears that he restarted Xigduo because he misunderstood provider instructions.  Hx rash possibly related to Xigduo. Noted to PCP that medication was >$300, but appears this was the Xigduo, not the Rybelsus . Current glucose readings: not checking . [facility]. Confirmed that with savings card, Rybelsus is $24.99.  . Will provide sample of Rybelsus 3 mg daily to patient to determine tolerability prior to paying paying for medication. He will come by  our clinic today to pick up sample. Scheduled f/u call in 3 weeks to discuss tolerability, glucose readings, and send script for 7 mg if 3 mg is tolerated.  . [facility], asked that they place the Rybelsus 3 mg back in stock.  . Patient scheduled for fasting lab work in ~ 6 weeks  Hyperlipidemia: . Uncontrolled; current treatment: none; prescribed pravastatin 20 mg daily, but he notes that he did not realize this was to be a chronic prescription and he has not been taking  . Restart pravastatin 20 mg daily. [facility] to [facility]. Patient verbalizes understanding  Patient Goals/Self-Care Activities . Over the next 90 days, patient will:  - take medications as prescribed check blood glucose daily, document, and provide at future appointments  Follow Up Plan: Telephone follow up appointment with care management team member scheduled for: ~ 3 weeks       Plan: Telephone follow up appointment with care management team member scheduled for: ~ 3 weeks  Catie Darnelle Maffucci, PharmD, [place], [place] Pharmacist [place] [phone]

## 2020-09-27 NOTE — Patient Instructions (Addendum)
Mr. Seres,   Your two daily medications are:   1) Rybelsus 3 mg daily for diabetes. Take this medication first thing in the morning on an empty stomach and wait at least 30 minutes after to take any other medications or to eat. This is the sample we are giving you today. Check your blood sugars twice daily - fasting (before breakfast) and about 2 hours after the largest meal of your day. Our goal fasting blood sugars are less than 130, and our goal after meal blood sugars are less than 180.   2) pravastatin 20 mg daily for cholesterol, and to prevent heart attacks or strokes. Take this medication in the evening. Your body makes cholesterol overnight, so the medication is most effective if you take it in the evening. [facility].    Please call me with any questions or concerns. We will chat on December 30th to talk about your blood sugars and answer any questions you have.   Take Care!  Catie Darnelle Maffucci, PharmD [phone]   Visit Information  Patient Care Plan: Medication Management    Problem Identified: Diabetes, Hyperlipidemia     Long-Range Goal: Disease Progression Prevention   This Visit's Progress: On track  Priority: High  Note:   Current Barriers:  . Unable to independently afford treatment regimen . Unable to achieve control of diabetes   Pharmacist Clinical Goal(s):  Marland Kitchen Over the next 90 days, patient will verbalize ability to afford treatment regimen. . Over the next 90 days, patient will achieve control of diabetes as evidenced by improvement in A1c through collaboration with PharmD and provider.   Interventions: . Inter-disciplinary care team collaboration (see longitudinal plan of care) . Comprehensive medication review performed; medication list updated in electronic medical record  Diabetes: . Uncontrolled; current treatment: prescribed Rybelsus 3 mg daily, but per patient discussion today, appears that he restarted Xigduo because he  misunderstood provider instructions.  Hx rash possibly related to Xigduo. Noted to PCP that medication was >$300, but appears this was the Xigduo, not the Rybelsus . Current glucose readings: not checking . [facility] Confirmed that with savings card, Rybelsus is $24.99.  . Will provide sample of Rybelsus 3 mg daily to patient to determine tolerability prior to paying paying for medication. He will come by our clinic today to pick up sample. Scheduled f/u call in 3 weeks to discuss tolerability, glucose readings, and send script for 7 mg if 3 mg is tolerated.  . [facility], asked that they place the Rybelsus 3 mg back in stock.  . Patient scheduled for fasting lab work in ~ 6 weeks  Hyperlipidemia: . Uncontrolled; current treatment: none; prescribed pravastatin 20 mg daily, but he notes that he did not realize this was to be a chronic prescription and he has not been taking  . Restart pravastatin 20 mg daily. [facility]. Patient verbalizes understanding  Patient Goals/Self-Care Activities . Over the next 90 days, patient will:  - take medications as prescribed check blood glucose daily, document, and provide at future appointments  Follow Up Plan: Telephone follow up appointment with care management team member scheduled for: ~ 3 weeks      Mr. Mitrano was given information about Chronic Care Management services today including:  1. CCM service includes personalized support from designated clinical staff supervised by his physician, including individualized plan of care and coordination with other care providers 2. 24/7 contact phone numbers for assistance for urgent and  routine care needs. 3. The patient may stop CCM services at any time (effective at the end of the month) by phone call to the office staff.  Patient agreed to services and verbal consent obtained.   Print copy of patient instructions, educational materials, and care plan provided in person.  Plan:  Telephone follow up appointment with care management team member scheduled for: ~ 3 weeks  Catie Darnelle Maffucci, PharmD, [place], [place] Pharmacist [place] [facility] [phone]

## 2020-10-17 ENCOUNTER — Ambulatory Visit: Payer: Commercial Managed Care - PPO | Admitting: Pharmacist

## 2020-10-17 DIAGNOSIS — E785 Hyperlipidemia, unspecified: Secondary | ICD-10-CM

## 2020-10-17 DIAGNOSIS — E119 Type 2 diabetes mellitus without complications: Secondary | ICD-10-CM

## 2020-10-17 NOTE — Chronic Care Management (AMB) (Signed)
Care Management   Pharmacy Note  10/17/2020 Name: Bill Herrera MRN: [id number] DOB: 28-May-1981  Esgar Barnick is a 39 y.o. year old male who is a primary care patient of McLean-Scocuzza, Pasty Spillers, MD. The Care Management/Care Coordination team team was consulted for assistance with care management and care coordination needs.     Engaged with patient by telephone for follow up visit in response to provider referral for pharmacy case management and/or care coordination services.   Consent to Services:  Mr. Knoth was given information about care management/care coordination services, agreed to services, and gave verbal consent prior to initiation of services. Please see initial visit note for detailed documentation.   Review of patient status, including review of consultants reports, laboratory and other test data, was performed as part of comprehensive evaluation and provision of chronic care management services.   SDOH (Social Determinants of Health) assessments and interventions performed:  none  Objective:  Lab Results  Component Value Date   CREATININE 0.88 01/12/2020   CREATININE 0.82 09/29/2019   CREATININE 0.79 04/07/2019    Lab Results  Component Value Date   HGBA1C 7.0 (A) 04/26/2020       Component Value Date/Time   CHOL 177 01/12/2020 0931   TRIG 266.0 (H) 01/12/2020 0931   HDL 28.50 (L) 01/12/2020 0931   CHOLHDL 6 01/12/2020 0931   VLDL 53.2 (H) 01/12/2020 0931   LDLCALC 124 (H) 09/29/2019 0821   LDLDIRECT 102.0 01/12/2020 0931      BP Readings from Last 3 Encounters:  04/26/20 118/76  06/30/19 114/73  06/02/19 118/76    Care Plan  No Known Allergies  Medications Reviewed Today    Reviewed by Lourena Simmonds, RPH-CPP (Pharmacist) on 09/27/20 at [phone]  Med List Status: <None>  Medication Order Taking? Sig Documenting Provider Last Dose Status Informant  azithromycin (ZITHROMAX) 250 MG tablet [id number] Yes 2 pills day 1 and 1 pill day 2-5 mg with  food McLean-Scocuzza, Pasty Spillers, MD Taking Active   clobetasol cream (TEMOVATE) 0.05 % [id number] Yes Apply 1 application topically 2 (two) times daily. Right arm abdomen McLean-Scocuzza, Pasty Spillers, MD Taking Active   fluticasone [facility]) 50 MCG/ACT nasal spray [id number] Yes Place 2 sprays into both nostrils daily as needed for allergies or rhinitis. After nasal saline McLean-Scocuzza, Pasty Spillers, MD Taking Active   glucose blood test strip [id number] Yes Use as instructed E11.9. Accu chek guid test strips check blood sugar in am before breakfast, after lunch and after dinner McLean-Scocuzza, Pasty Spillers, MD Taking Active   Lancets (ACCU-CHEK SOFT TOUCH) lancets [id number] Yes E 11.9 Use as instructed check blood sugar 3x per day McLean-Scocuzza, Pasty Spillers, MD Taking Active   miconazole (MICATIN) 2 % cream [id number] Yes Apply 1 application topically 2 (two) times daily. To groin x 1-2 weeks as needed groin area/inner thigh McLean-Scocuzza, Pasty Spillers, MD Taking Active   pravastatin (PRAVACHOL) 20 MG tablet [id number] No Take 1 tablet (20 mg total) by mouth at bedtime.  Patient not taking: No sig reported   McLean-Scocuzza, Pasty Spillers, MD Not Taking Active   Semaglutide (RYBELSUS) 3 MG TABS [id number] No Take 1 tablet by mouth daily.  Patient not taking: No sig reported   McLean-Scocuzza, Pasty Spillers, MD Not Taking Active   sodium chloride (OCEAN) 0.65 % SOLN nasal spray [id number] Yes Place 2 sprays into both nostrils as needed for congestion. McLean-Scocuzza, Pasty Spillers, MD Taking Active           Patient Active  Problem List   Diagnosis Date Noted  . Intertrigo 11/21/2019  . Annual physical exam 09/22/2019  . Fatty liver 04/13/2019  . Vitamin D deficiency 04/07/2019  . HLD (hyperlipidemia) 04/07/2019  . Elevated liver enzymes 09/23/2018  . Dermatitis 09/23/2018  . Tinea cruris 09/23/2018  . Type 2 diabetes mellitus without complication, without long-term current use of insulin ([place]) 09/23/2018    Conditions to be  addressed/monitored per PCP order: HLD and DMII  Patient Care Plan: Medication Management    Problem Identified: Diabetes, Hyperlipidemia     Long-Range Goal: Disease Progression Prevention   Recent Progress: On track  Priority: High  Note:   Current Barriers:  . Unable to independently afford treatment regimen . Unable to achieve control of diabetes   Pharmacist Clinical Goal(s):  Marland Kitchen Over the next 90 days, patient will verbalize ability to afford treatment regimen. . Over the next 90 days, patient will achieve control of diabetes as evidenced by improvement in A1c through collaboration with PharmD and provider.   Interventions: . Inter-disciplinary care team collaboration (see longitudinal plan of care) . Comprehensive medication review performed; medication list updated in electronic medical record  Diabetes: . Uncontrolled; current treatment: Rybelsus 3 mg daily. Has been taking for ~ 3 weeks. Denies any intolerability concerns. Confirms taking on an empty stomach first thing in the morning.  . Current glucose readings: not checking. Notes he checked yesterday, but does not remember the reading . Continue current regimen. Pick up script for Rybelsus at pharmacy w/ savings card. Patient to check glucose readings at home. If elevated, or if next A1c elevated, increase Rybelsus to 7 mg daily.   Hyperlipidemia: . Uncontrolled; current treatment: pravastatin 20 mg QPM. Denies intolerability concerns . Continue current regimen at this time. Lab work upcoming in ~ 2 weeks  Patient Goals/Self-Care Activities . Over the next 90 days, patient will:  - take medications as prescribed check blood glucose daily, document, and provide at future appointments  Follow Up Plan: Telephone follow up appointment with care management team member scheduled for: ~ 6 weeks      Medication Assistance: None required. Patient affirms current coverage meets needs.   Plan: Telephone follow up  appointment with care management team member scheduled for: ~ 6 weeks  Catie Darnelle Maffucci, PharmD, [place], [place] Clinical Pharmacist [employer] at [employer] [phone]

## 2020-10-17 NOTE — Patient Instructions (Signed)
Visit Information  Patient Care Plan: Medication Management    Problem Identified: Diabetes, Hyperlipidemia     Long-Range Goal: Disease Progression Prevention   Recent Progress: On track  Priority: High  Note:   Current Barriers:  . Unable to independently afford treatment regimen . Unable to achieve control of diabetes   Pharmacist Clinical Goal(s):  Marland Kitchen Over the next 90 days, patient will verbalize ability to afford treatment regimen. . Over the next 90 days, patient will achieve control of diabetes as evidenced by improvement in A1c through collaboration with PharmD and provider.   Interventions: . Inter-disciplinary care team collaboration (see longitudinal plan of care) . Comprehensive medication review performed; medication list updated in electronic medical record  Diabetes: . Uncontrolled; current treatment: Rybelsus 3 mg daily. Has been taking for ~ 3 weeks. Denies any intolerability concerns. Confirms taking on an empty stomach first thing in the morning.  . Current glucose readings: not checking. Notes he checked yesterday, but does not remember the reading . Continue current regimen. Pick up script for Rybelsus at pharmacy w/ savings card. Patient to check glucose readings at home. If elevated, or if next A1c elevated, increase Rybelsus to 7 mg daily.   Hyperlipidemia: . Uncontrolled; current treatment: pravastatin 20 mg QPM. Denies intolerability concerns . Continue current regimen at this time. Lab work upcoming in ~ 2 weeks  Patient Goals/Self-Care Activities . Over the next 90 days, patient will:  - take medications as prescribed check blood glucose daily, document, and provide at future appointments  Follow Up Plan: Telephone follow up appointment with care management team member scheduled for: ~ 6 weeks      The patient verbalized understanding of instructions, educational materials, and care plan provided today and declined offer to receive copy of patient  instructions, educational materials, and care plan.     Plan: Telephone follow up appointment with care management team member scheduled for: ~ 6 weeks  Catie Feliz Beam, PharmD, [place], CPP Clinical Pharmacist [employer] at [employer] [phone]

## 2020-11-01 ENCOUNTER — Other Ambulatory Visit: Payer: Commercial Managed Care - PPO

## 2020-11-08 ENCOUNTER — Ambulatory Visit: Payer: Commercial Managed Care - PPO | Admitting: Internal Medicine

## 2020-11-11 ENCOUNTER — Telehealth: Payer: Commercial Managed Care - PPO

## 2020-11-11 ENCOUNTER — Telehealth: Payer: Self-pay

## 2020-11-11 NOTE — Chronic Care Management (AMB) (Signed)
  Care Management   Note  11/11/2020 Name: Damani Kelemen MRN: [id number] DOB: Aug 28, 1981  Luisfernando Brightwell is a 40 y.o. year old male who is a primary care patient of McLean-Scocuzza, Nino Glow, MD and is actively engaged with the care management team. I reached out to [facility] by phone today to assist with re-scheduling a follow up visit with the Pharmacist  Follow up plan: Unsuccessful telephone outreach attempt made. A HIPAA compliant phone message was left for the patient providing contact information and requesting a return call.  The care management team will reach out to the patient again over the next 7 days.  If patient returns call to provider office, please advise to call [facility]  at [place], [place], [place], [place] [address] Direct Dial: [phone] [email or web address]: [email or web address]

## 2020-11-15 NOTE — Chronic Care Management (AMB) (Signed)
  Care Management   Note  11/15/2020 Name: Bill Herrera MRN: [id number] DOB: 1980/12/17  Bill Herrera is a 39 y.o. year old male who is a primary care patient of McLean-Scocuzza, Nino Glow, MD and is actively engaged with the care management team. I reached out to [facility] by phone today to assist with re-scheduling a follow up visit with the Pharmacist  Follow up plan: Unsuccessful telephone outreach attempt made. A HIPAA compliant phone message was left for the patient providing contact information and requesting a return call.  The care management team will reach out to the patient again over the next 3 days.  If patient returns call to provider office, please advise to call [place]  at [place], [place], [place], [place] [address] Direct Dial: [phone] [email or web address] Website: [email or web address]

## 2020-11-22 ENCOUNTER — Other Ambulatory Visit (INDEPENDENT_AMBULATORY_CARE_PROVIDER_SITE_OTHER): Payer: Commercial Managed Care - PPO

## 2020-11-22 ENCOUNTER — Other Ambulatory Visit: Payer: Self-pay

## 2020-11-22 DIAGNOSIS — E119 Type 2 diabetes mellitus without complications: Secondary | ICD-10-CM | POA: Diagnosis not present

## 2020-11-22 DIAGNOSIS — L309 Dermatitis, unspecified: Secondary | ICD-10-CM | POA: Diagnosis not present

## 2020-11-22 DIAGNOSIS — E559 Vitamin D deficiency, unspecified: Secondary | ICD-10-CM

## 2020-11-22 DIAGNOSIS — Z1389 Encounter for screening for other disorder: Secondary | ICD-10-CM | POA: Diagnosis not present

## 2020-11-22 DIAGNOSIS — Z1329 Encounter for screening for other suspected endocrine disorder: Secondary | ICD-10-CM

## 2020-11-22 DIAGNOSIS — Z13818 Encounter for screening for other digestive system disorders: Secondary | ICD-10-CM

## 2020-11-22 LAB — LIPID PANEL
Cholesterol: 123 mg/dL (ref 0–200)
HDL: 30.6 mg/dL — ABNORMAL LOW (ref >39.00)
LDL Cholesterol: 74 mg/dL (ref 0–99)
NonHDL: 92.33
Total CHOL/HDL Ratio: 4
Triglycerides: 92 mg/dL (ref 0.0–149.0)
VLDL: 18.4 mg/dL (ref 0.0–40.0)

## 2020-11-22 LAB — CBC WITH DIFFERENTIAL/PLATELET
Basophils Absolute: 0 10*3/uL (ref 0.0–0.1)
Basophils Relative: 0.3 % (ref 0.0–3.0)
Eosinophils Absolute: 0.1 10*3/uL (ref 0.0–0.7)
Eosinophils Relative: 1.5 % (ref 0.0–5.0)
HCT: 41.9 % (ref 39.0–52.0)
Hemoglobin: 14.1 g/dL (ref 13.0–17.0)
Lymphocytes Relative: 25.3 % (ref 12.0–46.0)
Lymphs Abs: 1.5 10*3/uL (ref 0.7–4.0)
MCHC: 33.5 g/dL (ref 30.0–36.0)
MCV: 87.5 fl (ref 78.0–100.0)
Monocytes Absolute: 0.5 10*3/uL (ref 0.1–1.0)
Monocytes Relative: 8.3 % (ref 3.0–12.0)
Neutro Abs: 3.9 10*3/uL (ref 1.4–7.7)
Neutrophils Relative %: 64.6 % (ref 43.0–77.0)
Platelets: 247 10*3/uL (ref 150.0–400.0)
RBC: 4.8 Mil/uL (ref 4.22–5.81)
RDW: 13.8 % (ref 11.5–15.5)
WBC: 6.1 10*3/uL (ref 4.0–10.5)

## 2020-11-22 LAB — URINALYSIS, ROUTINE W REFLEX MICROSCOPIC
Bilirubin Urine: NEGATIVE
Hgb urine dipstick: NEGATIVE
Ketones, ur: NEGATIVE
Leukocytes,Ua: NEGATIVE
Nitrite: NEGATIVE
RBC / HPF: NONE SEEN (ref 0–?)
Specific Gravity, Urine: 1.01 (ref 1.000–1.030)
Total Protein, Urine: NEGATIVE
Urine Glucose: NEGATIVE
Urobilinogen, UA: 0.2 (ref 0.0–1.0)
pH: 6 (ref 5.0–8.0)

## 2020-11-22 LAB — TSH: TSH: 0.95 u[IU]/mL (ref 0.35–4.50)

## 2020-11-22 LAB — COMPREHENSIVE METABOLIC PANEL
ALT: 50 U/L (ref 0–53)
AST: 26 U/L (ref 0–37)
Albumin: 4.4 g/dL (ref 3.5–5.2)
Alkaline Phosphatase: 106 U/L (ref 39–117)
BUN: 11 mg/dL (ref 6–23)
CO2: 28 mEq/L (ref 19–32)
Calcium: 9.3 mg/dL (ref 8.4–10.5)
Chloride: 102 mEq/L (ref 96–112)
Creatinine, Ser: 0.88 mg/dL (ref 0.40–1.50)
GFR: 109.51 mL/min (ref >60.00)
Glucose, Bld: 114 mg/dL — ABNORMAL HIGH (ref 70–99)
Potassium: 4 mEq/L (ref 3.5–5.1)
Sodium: 137 mEq/L (ref 135–145)
Total Bilirubin: 0.5 mg/dL (ref 0.2–1.2)
Total Protein: 7 g/dL (ref 6.0–8.3)

## 2020-11-22 LAB — HEMOGLOBIN A1C: Hgb A1c MFr Bld: 7.6 % — ABNORMAL HIGH (ref 4.6–6.5)

## 2020-11-22 LAB — VITAMIN D 25 HYDROXY (VIT D DEFICIENCY, FRACTURES): VITD: 26.73 ng/mL — ABNORMAL LOW (ref 30.00–100.00)

## 2020-11-22 LAB — MICROALBUMIN / CREATININE URINE RATIO
Creatinine,U: 77.9 mg/dL
Microalb Creat Ratio: 0.9 mg/g (ref 0.0–30.0)
Microalb, Ur: 0.7 mg/dL (ref 0.0–1.9)

## 2020-11-25 LAB — GLUTAMIC ACID DECARBOXYLASE AUTO ABS: Glutamic Acid Decarb Ab: <5 IU/mL (ref <5)

## 2020-11-25 LAB — HEPATITIS C ANTIBODY
Hepatitis C Ab: NONREACTIVE
SIGNAL TO CUT-OFF: 0.04 (ref <1.00)

## 2020-11-27 NOTE — Chronic Care Management (AMB) (Signed)
  Care Management   Note  11/27/2020 Name: Bill Herrera MRN: [id number] DOB: [birth year]  Bill Herrera is a 40 y.o. year old male who is a primary care patient of McLean-Scocuzza, Nino Glow, MD and is actively engaged with the care management team. I reached out to [facility] by phone today to assist with re-scheduling a follow up visit with the Pharmacist  Follow up plan: Telephone appointment with care management team member scheduled for:12/04/2020   Noreene Larsson, [place], [place], [place] [address] Direct Dial: [phone] [email or web address]: [email or web address]

## 2020-12-04 ENCOUNTER — Ambulatory Visit: Payer: Commercial Managed Care - PPO | Admitting: Pharmacist

## 2020-12-04 DIAGNOSIS — E119 Type 2 diabetes mellitus without complications: Secondary | ICD-10-CM

## 2020-12-04 DIAGNOSIS — E559 Vitamin D deficiency, unspecified: Secondary | ICD-10-CM

## 2020-12-04 DIAGNOSIS — E785 Hyperlipidemia, unspecified: Secondary | ICD-10-CM

## 2020-12-04 MED ORDER — RYBELSUS 7 MG PO TABS: 7.0000 mg | ORAL_TABLET | Freq: Every day | ORAL | 1 refills | Status: DC

## 2020-12-04 NOTE — Chronic Care Management (AMB) (Signed)
Care Management   Pharmacy Note  12/04/2020 Name: Bill Herrera MRN: [id number] DOB: June 14, 1981  Subjective: Bill Herrera is a 40 y.o. year old male who is a primary care patient of McLean-Scocuzza, Nino Glow, MD. The Care Management team was consulted for assistance with care management and care coordination needs.    Engaged with patient by telephone for follow up visit in response to provider referral for pharmacy case management and/or care coordination services.   The patient was given information about Care Management services today including:  1. Care Management services includes personalized support from designated clinical staff supervised by the patient's primary care provider, including individualized plan of care and coordination with other care providers. 2. 24/7 contact phone numbers for assistance for urgent and routine care needs. 3. The patient may stop case management services at any time by phone call to the office staff.  Patient agreed to services and consent obtained.  Assessment:  Review of patient status, including review of consultants reports, laboratory and other test data, was performed as part of comprehensive evaluation and provision of chronic care management services.   SDOH (Social Determinants of Health) assessments and interventions performed:  SDOH Interventions   Flowsheet Row Most Recent Value  SDOH Interventions   Financial Strain Interventions Intervention Not Indicated       Objective:  Lab Results  Component Value Date   CREATININE 0.88 11/22/2020   CREATININE 0.88 01/12/2020   CREATININE 0.82 09/29/2019    Lab Results  Component Value Date   HGBA1C 7.6 (H) 11/22/2020       Component Value Date/Time   CHOL 123 11/22/2020 0934   TRIG 92.0 11/22/2020 0934   HDL 30.60 (L) 11/22/2020 0934   CHOLHDL 4 11/22/2020 0934   VLDL 18.4 11/22/2020 0934   LDLCALC 74 11/22/2020 0934   LDLDIRECT 102.0 01/12/2020 0931   Last vitamin D Lab  Results  Component Value Date   VD25OH 26.73 (L) 11/22/2020   Clinical ASCVD: No   BP Readings from Last 3 Encounters:  04/26/20 118/76  06/30/19 114/73  06/02/19 118/76    Care Plan  No Known Allergies  Medications Reviewed Today    Reviewed by De Hollingshead, RPH-CPP (Pharmacist) on 09/27/20 at [phone]  Med List Status: <None>  Medication Order Taking? Sig Documenting Provider Last Dose Status Informant  azithromycin (ZITHROMAX) 250 MG tablet [id number] Yes 2 pills day 1 and 1 pill day 2-5 mg with food McLean-Scocuzza, Nino Glow, MD Taking Active   clobetasol cream (TEMOVATE) 0.05 % [id number] Yes Apply 1 application topically 2 (two) times daily. Right arm abdomen McLean-Scocuzza, Nino Glow, MD Taking Active   fluticasone [facility]) 50 MCG/ACT nasal spray [id number] Yes Place 2 sprays into both nostrils daily as needed for allergies or rhinitis. After nasal saline McLean-Scocuzza, Nino Glow, MD Taking Active   glucose blood test strip [id number] Yes Use as instructed E11.9. Accu chek guid test strips check blood sugar in am before breakfast, after lunch and after dinner McLean-Scocuzza, Nino Glow, MD Taking Active   Lancets (ACCU-CHEK SOFT TOUCH) lancets [id number] Yes E 11.9 Use as instructed check blood sugar 3x per day McLean-Scocuzza, Nino Glow, MD Taking Active   miconazole (MICATIN) 2 % cream [id number] Yes Apply 1 application topically 2 (two) times daily. To groin x 1-2 weeks as needed groin area/inner thigh McLean-Scocuzza, Nino Glow, MD Taking Active   pravastatin (PRAVACHOL) 20 MG tablet [id number] No Take 1 tablet (20 mg total) by mouth at bedtime.  Patient  not taking: No sig reported   McLean-Scocuzza, Nino Glow, MD Not Taking Active   Semaglutide (RYBELSUS) 3 MG TABS [id number] No Take 1 tablet by mouth daily.  Patient not taking: No sig reported   McLean-Scocuzza, Nino Glow, MD Not Taking Active   sodium chloride (OCEAN) 0.65 % SOLN nasal spray [id number] Yes Place 2 sprays into both  nostrils as needed for congestion. McLean-Scocuzza, Nino Glow, MD Taking Active           Patient Active Problem List   Diagnosis Date Noted  . Intertrigo 11/21/2019  . Annual physical exam 09/22/2019  . Fatty liver 04/13/2019  . Vitamin D deficiency 04/07/2019  . HLD (hyperlipidemia) 04/07/2019  . Elevated liver enzymes 09/23/2018  . Dermatitis 09/23/2018  . Tinea cruris 09/23/2018  . Type 2 diabetes mellitus without complication, without long-term current use of insulin ([place]) 09/23/2018    Conditions to be addressed/monitored: HLD and DMII  Care Plan : Medication Management  Updates made by De Hollingshead, RPH-CPP since 12/04/2020 12:00 AM    Problem: Diabetes, Hyperlipidemia     Long-Range Goal: Disease Progression Prevention   Recent Progress: On track  Priority: High  Note:   Current Barriers:  . Unable to independently afford treatment regimen . Unable to achieve control of diabetes   Pharmacist Clinical Goal(s):  Marland Kitchen Over the next 90 days, patient will verbalize ability to afford treatment regimen. . Over the next 90 days, patient will achieve control of diabetes as evidenced by improvement in A1c through collaboration with PharmD and provider.   Interventions: . Inter-disciplinary care team collaboration (see longitudinal plan of care) . Comprehensive medication review performed; medication list updated in electronic medical record  Health Maintenance: . Reviewed recent lab results with patient. Passed along PCP recommendation to start Vitamin D 4000 units daily.   Diabetes: . Uncontrolled; current treatment: Rybelsus 3 mg daily . Denies GI concerns w/ Rybelsus.  . Current glucose readings: checking occasionally at home, but does not have reader with him to review.  . Reviewed goal A1c, goal fasting, and goal 2 hour post prandial glucose. Reviewed macrovascular and microvascular complications of uncontrolled diabetes.  . Discussed increasing Rybelsus to 7 mg  daily. Patient amenable. Assisted in downloading Rybelsus savings card from manufacturer website and send that information in with prescription, sent card to patient's email. Will also print and mail physical card along w/ AVS . Reviewed appropriate administration  Hyperlipidemia: . Controlled current treatment: pravastatin 20 mg QPM.  . Denies intolerability concerns . Continue current regimen at this time. Praised for improvement in LDL.   Patient Goals/Self-Care Activities . Over the next 90 days, patient will:  - take medications as prescribed check blood glucose daily, document, and provide at future appointments  Follow Up Plan: Telephone follow up appointment with care management team member scheduled for: ~ 6 weeks      Medication Assistance:  None required.  Patient affirms current coverage meets needs.  Follow Up:  Patient agrees to Care Plan and Follow-up.  Plan: Telephone follow up appointment with care management team member scheduled for:  ~ 6 weeks  Catie Darnelle Maffucci, PharmD, [place], [place] Clinical Pharmacist [employer] at [employer] [phone]

## 2020-12-04 NOTE — Patient Instructions (Signed)
Bill Herrera,   It was great talking with you today!  Increase Rybelsus to 7 mg daily. Remember to take this first thing in the morning on an empty stomach and wait at least 30 minutes before eating or taking other medications.   Our goal hemoglobin A1c is <7%. This is the best way to reduce the risk of long term complications from diabetes - heart disease, kidney disease, etc.   An A1c of <7% corresponds with fasting sugar readings <130 and 2 hour after meal readings <180.   You may notice a decreased appetite when you increase Rybelsus. This is normal, as this medication makes you feel full quicker and stay full longer.   Keep up the great work with taking your pravastatin daily to control your cholesterol and reduce your risk of heart attacks and strokes.   Call me with any questions or concerns!  Catie Darnelle Maffucci, PharmD [phone]  Visit Information  Goals Addressed              This Visit's Progress     Patient Stated   .  Medication Monitoring (pt-stated)        Patient Goals/Self-Care Activities . Over the next 90 days, patient will:  - take medications as prescribed check blood glucose daily, document, and provide at future appointments        The patient verbalized understanding of instructions, educational materials, and care plan provided today and agreed to receive a mailed copy of patient instructions, educational materials, and care plan.  Plan: Telephone follow up appointment with care management team member scheduled for:  ~ 6 weeks  Catie Darnelle Maffucci, PharmD, [place], [place] Clinical Pharmacist [employer] at [employer] [phone]

## 2021-01-08 ENCOUNTER — Telehealth: Payer: Commercial Managed Care - PPO

## 2021-01-08 ENCOUNTER — Telehealth: Payer: Self-pay | Admitting: Pharmacist

## 2021-01-08 NOTE — Telephone Encounter (Signed)
  Chronic Care Management   Note  01/08/2021 Name: Furqan Gosselin MRN: [id number] DOB: 04/29/1981   Attempted to contact patient for scheduled appointment for medication management support. Left HIPAA compliant message for patient to return my call at their convenience.    Plan: - If I do not hear back from the patient by end of business today, will collaborate with Care Guide to outreach to schedule follow up with me  Catie Darnelle Maffucci, PharmD, [place], [place] Pharmacist [employer] at [employer] [phone]

## 2021-01-24 ENCOUNTER — Ambulatory Visit: Payer: Commercial Managed Care - PPO | Admitting: Internal Medicine

## 2021-01-24 ENCOUNTER — Telehealth: Payer: Self-pay | Admitting: Internal Medicine

## 2021-01-24 NOTE — Telephone Encounter (Signed)
Patient no-showed today's appointment; appointment was for 01/24/21 at 9:30 am, provider notified for review of record. Letter sent for patient to call in and re-schedule.

## 2021-01-29 NOTE — Telephone Encounter (Signed)
Yes, please outreach to schedule anytime in the month of May. I seem to remember he prefers Fridays due to his work schedule.

## 2021-01-29 NOTE — Telephone Encounter (Signed)
Catie this patient did not make his PCP appointment on 01/24/2021,  there is nothing rescheduled ,do you still want him on your schedule ?

## 2021-01-29 NOTE — Telephone Encounter (Signed)
Please reschedule f/u with Pharm D [facility] 4-6 weeks after PCP appt

## 2021-01-30 ENCOUNTER — Telehealth: Payer: Self-pay | Admitting: *Deleted

## 2021-01-30 NOTE — Chronic Care Management (AMB) (Signed)
  Care Management   Note  01/30/2021 Name: Yovanny Coats MRN: [id number] DOB: 1981-08-15  Dontrail Blackwell is a 41 y.o. year old male who is a primary care patient of McLean-Scocuzza, Nino Glow, MD and is actively engaged with the care management team. I reached out to [facility] by phone today to assist with re-scheduling a follow up visit with the Pharmacist  Follow up plan: Unsuccessful telephone outreach attempt made. A HIPAA compliant phone message was left for the patient providing contact information and requesting a return call.  The care management team will reach out to the patient again over the next 5 days.  If patient returns call to provider office, please advise to call [place] Lysle Morales at [facility]

## 2021-02-05 NOTE — Chronic Care Management (AMB) (Signed)
  Care Management   Note  02/05/2021 Name: [name] MRN: [id number] DOB: 1981-01-16  Bill Herrera is a 40 y.o. year old male who is a primary care patient of McLean-Scocuzza, Nino Glow, MD and is actively engaged with the care management team. I reached out to [facility] by phone today to assist with re-scheduling a follow up visit with the Pharmacist  Follow up plan: A second unsuccessful telephone outreach attempt made. A HIPAA compliant phone message was left for the patient providing contact information and requesting a return call.  The care management team will reach out to the patient again over the next 5 days.  If patient returns call to provider office, please advise to call [place] Lysle Morales at [facility]

## 2021-02-07 NOTE — Telephone Encounter (Signed)
Noted  Mail letter

## 2021-02-07 NOTE — Chronic Care Management (AMB) (Signed)
  Care Management   Note  02/07/2021 Name: Brandonn Capelli MRN: [id number] DOB: 03-Oct-1981  Chukwudi Ewen is a 40 y.o. year old male who is a primary care patient of McLean-Scocuzza, Nino Glow, MD and is actively engaged with the care management team. I reached out to [facility] by phone today to assist with re-scheduling a follow up visit with the Pharmacist.  Follow up plan: A third unsuccessful telephone outreach attempt made. A HIPAA compliant phone message was left for the patient providing contact information and requesting a return call. Unable to make contact on outreach attempts x 3. PCP McLean-Scocuzza, Nino Glow, MD. notified via routed documentation in medical record. We have been unable to make contact with the patient for follow up. The care management team is available to follow up with the patient after provider conversation with the patient regarding recommendation for care management engagement and subsequent re-referral to the care management team. If patient returns call to provider Office, please advise to call [place] at [phone].  [facility]

## 2021-04-30 ENCOUNTER — Telehealth: Payer: Self-pay

## 2021-04-30 NOTE — Telephone Encounter (Signed)
Returned call. Left voicemail for patient to return my call at his convenience.

## 2021-04-30 NOTE — Telephone Encounter (Signed)
Pt called and states that the Semaglutide (RYBELSUS) 7 MG TABS is going up to $500 and he only has one pill left. Please call pt to advise. He was asking to speak with someone about a discount

## 2021-05-01 NOTE — Telephone Encounter (Signed)
Returned call. Left voicemail for patient to return my call at his convenience.

## 2021-05-02 NOTE — Telephone Encounter (Signed)
Patient never returned my call.   If he calls back, would recommend talking with pharmacy to determine why savings card isn't picking up cost anymore.

## 2021-05-06 ENCOUNTER — Encounter: Payer: Self-pay | Admitting: Internal Medicine

## 2021-05-06 ENCOUNTER — Other Ambulatory Visit: Payer: Self-pay

## 2021-05-06 ENCOUNTER — Ambulatory Visit: Payer: Commercial Managed Care - PPO | Admitting: Internal Medicine

## 2021-05-06 VITALS — BP 110/80 | HR 75 | Temp 99.1°F | Ht 65.0 in | Wt 177.0 lb

## 2021-05-06 DIAGNOSIS — E559 Vitamin D deficiency, unspecified: Secondary | ICD-10-CM | POA: Diagnosis not present

## 2021-05-06 DIAGNOSIS — L409 Psoriasis, unspecified: Secondary | ICD-10-CM

## 2021-05-06 DIAGNOSIS — E119 Type 2 diabetes mellitus without complications: Secondary | ICD-10-CM

## 2021-05-06 DIAGNOSIS — Z0000 Encounter for general adult medical examination without abnormal findings: Secondary | ICD-10-CM

## 2021-05-06 MED ORDER — RYBELSUS 7 MG PO TABS: 7.0000 mg | ORAL_TABLET | Freq: Every day | ORAL | 3 refills | Status: DC

## 2021-05-06 MED ORDER — TRIAMCINOLONE ACETONIDE 0.1 % EX CREA: 1.0000 "application " | TOPICAL_CREAM | Freq: Two times a day (BID) | CUTANEOUS | 1 refills | Status: AC

## 2021-05-06 MED ORDER — CLOBETASOL PROPIONATE 0.05 % EX CREA: 1.0000 "application " | TOPICAL_CREAM | Freq: Two times a day (BID) | CUTANEOUS | 5 refills | Status: DC

## 2021-05-06 NOTE — Progress Notes (Signed)
Chief Complaint  Patient presents with   Follow-up   Annual  1. Has thick scaly lesion scalp and legs and per [facility] derm was psoriasis not creams temovate not helping and disc today Dr. Wyline [facility] and will get pt scheduled 2. DM 2 was having trouble with rybelsus 7 mg qd cost 11/22/20 7.6 need to sche repeat labs Cc catie  3. Fall x 1    Review of Systems  Constitutional:  Negative for weight loss.  HENT:  Negative for hearing loss.   Eyes:  Negative for blurred vision.  Respiratory:  Negative for shortness of breath.   Cardiovascular:  Negative for chest pain.  Gastrointestinal:  Negative for abdominal pain.  Musculoskeletal:  Positive for falls.  Skin:  Positive for itching and rash.  Neurological:  Negative for headaches.  Psychiatric/Behavioral:  Negative for depression.   Past Medical History:  Diagnosis Date   Diabetes mellitus without complication (HCC)    Frequent headaches    Lipoma    x4 removed with 12 more as of 09/22/2019   Past Surgical History:  Procedure Laterality Date   right cyst removal     right forehead    Family History  Problem Relation Age of Onset   Asthma Father    Diabetes Father        2   Hypertension Father    Social History   Socioeconomic History   Marital status: Single    Spouse name: Not on file   Number of children: Not on file   Years of education: Not on file   Highest education level: Not on file  Occupational History   Not on file  Tobacco Use   Smoking status: Never   Smokeless tobacco: Never  Substance and Sexual Activity   Alcohol use: Yes    Alcohol/week: 2.0 standard drinks    Types: 2 Cans of beer per week   Drug use: Not Currently   Sexual activity: Yes    Partners: Female  Other Topics Concern   Not on file  Social History Narrative   HS ed    HVAC    Single 2 kids    Social Determinants of Health   Financial Resource Strain: Low Risk    Difficulty of Paying Living Expenses: Not hard at all   Food Insecurity: Not on file  Transportation Needs: Not on file  Physical Activity: Not on file  Stress: Not on file  Social Connections: Not on file  Intimate Partner Violence: Not on file   Current Meds  Medication Sig   clobetasol cream (TEMOVATE) 0.96 % Apply 1 application topically 2 (two) times daily. Right arm abdomen   clobetasol cream (TEMOVATE) 2.83 % Apply 1 application topically 2 (two) times daily. Prn. Not face   glucose blood test strip Use as instructed E11.9. Accu chek guid test strips check blood sugar in am before breakfast, after lunch and after dinner   Lancets (ACCU-CHEK SOFT TOUCH) lancets E 11.9 Use as instructed check blood sugar 3x per day   pravastatin (PRAVACHOL) 20 MG tablet Take 1 tablet (20 mg total) by mouth at bedtime.   triamcinolone cream (KENALOG) 0.1 % Apply 1 application topically 2 (two) times daily. Prn   [DISCONTINUED] miconazole (MICATIN) 2 % cream Apply 1 application topically 2 (two) times daily. To groin x 1-2 weeks as needed groin area/inner thigh   [DISCONTINUED] Semaglutide (RYBELSUS) 7 MG TABS Take 7 mg by mouth daily.   No Known Allergies  No results found for this or any previous visit (from the past 2160 hour(s)). Objective  Body mass index is 29.45 kg/m. Wt Readings from Last 3 Encounters:  05/06/21 177 lb (80.3 kg)  09/20/20 182 lb (82.6 kg)  04/26/20 176 lb 12.8 oz (80.2 kg)   Temp Readings from Last 3 Encounters:  05/06/21 99.1 F (37.3 C) (Oral)  04/26/20 98.9 F (37.2 C) (Oral)  06/30/19 97.7 F (36.5 C)   BP Readings from Last 3 Encounters:  05/06/21 110/80  04/26/20 118/76  06/30/19 114/73   Pulse Readings from Last 3 Encounters:  05/06/21 75  04/26/20 72  06/30/19 62    Physical Exam Vitals and nursing note reviewed.  Constitutional:      Appearance: Normal appearance. He is well-developed and well-groomed.  HENT:     Head: Normocephalic and atraumatic.  Eyes:     Conjunctiva/sclera: Conjunctivae  normal.     Pupils: Pupils are equal, round, and reactive to light.  Cardiovascular:     Rate and Rhythm: Normal rate and regular rhythm.     Heart sounds: Normal heart sounds. No murmur heard. Pulmonary:     Effort: Pulmonary effort is normal.     Breath sounds: Normal breath sounds.  Skin:    General: Skin is warm and dry.  Neurological:     General: No focal deficit present.     Mental Status: He is alert and oriented to person, place, and time. Mental status is at baseline.     Gait: Gait normal.  Psychiatric:        Attention and Perception: Attention and perception normal.        Mood and Affect: Mood and affect normal.        Speech: Speech normal.        Behavior: Behavior normal. Behavior is cooperative.        Thought Content: Thought content normal.        Cognition and Memory: Cognition and memory normal.        Judgment: Judgment normal.    Assessment  Plan  Annual physical exam Flu utd  sch fasting labs 05/2021 tdap utd  moderna 2/2 consider booster  Consider pna 23 vaccine Consider hep B vaccine new x 2 doses hep A immune; h/o fatty liver HIV negative rec healthy diet and exercise given info  Type 2 diabetes mellitus without complication, without long-term current use of insulin (HCC) - Plan: Semaglutide (RYBELSUS) 7 MG TABS, Comprehensive metabolic panel, Lipid panel, Hemoglobin A1c, CBC with Differential/Platelet  Psoriasis - Plan: triamcinolone cream (KENALOG) 0.1 %, clobetasol cream (TEMOVATE) 0.05 %, CBC with Differential/Platelet F/u [facility] dr. Charlyne Quale   Vitamin D deficiency - Plan: VITAMIN D 25 Hydroxy (Vit-D Deficiency, Fractures)      Provider: Dr. Olivia Mackie McLean-Scocuzza-[facility]

## 2021-05-06 NOTE — Patient Instructions (Addendum)
Dermatology Diagnoses   Tattoo     Consider 3rd covid vaccine moderna   Eye doctors  [facility] Dr[facility] Darla Lesches [facility] *   Jamelle Rushing, MD   [address]   [address]   [place], [place] [address]   Phone: [phone]   Fax: [phone]   [place]   [address]   [place],  [address]   Phone: [phone]   Fax: [phone]      Psoriasis Psoriasis is a long-term (chronic) skin condition. It occurs because your immune system causes skin cells to form too quickly. As a result, too many skin cells grow and create raised, red patches (plaques) that often look silvery on your skin. Plaques may show up anywhere on your body. They can be any size or shape. Symptoms of this condition range from mildto very severe. Psoriasis cannot be passed from one person to another (is not contagious). Sometimes, the symptoms go away and then come back again. What are the causes? The cause of psoriasis is not known, but certain factors can make the condition worse. These include: Damage or trauma to the skin, such as cuts, scrapes, sunburn, and dryness. Not enough exposure to sunlight. Certain medicines. Alcohol. Tobacco use. Stress. Infections caused by bacteria or viruses. What increases the risk? You are more likely to develop this condition if you: Have a family history of psoriasis. Are obese. Are 67-34 years old. Are taking certain medicines. What are the signs or symptoms? There are different types of psoriasis. You can have more than one type of psoriasis during your life. The types are: Plaque. This is the most common. Guttate. This is also called eruptive psoriasis. Inverse. Pustular. Erythrodermic. Sebopsoriasis. Psoriatic arthritis. Each type of psoriasis has different symptoms. Plaque psoriasis symptoms include red, raised plaques with a silvery-white coating (scale). These plaques may be itchy. Your nails may  be pitted and crumbly or fall off. Guttate psoriasis symptoms include small red spots that often show up on your trunk, arms, and legs. These spots may develop after you have been sick, especially with strep throat. Inverse psoriasis symptoms include plaques in your underarm area, under your breasts, or on your genitals, groin, or buttocks. Pustular psoriasis symptoms include pus-filled bumps that are painful, red, and swollen on the palms of your hands or the soles of your feet. You also may feel exhausted, feverish, weak, or have no appetite. Erythrodermic psoriasis symptoms include bright red skin that may look burned. You may have a fast heartbeat and a body temperature that is too high or too low. You may be itchy or in pain. Sebopsoriasis symptoms include red plaques that have a greasy coating, and are often on your scalp, forehead, and face. Psoriatic arthritis causes swollen, painful joints along with scaly skin plaques. How is this diagnosed? This condition is diagnosed based on your symptoms, family history, and a physical exam. You may also be referred to a health care provider who specializes in skin diseases (dermatologist). Your health care provider may remove a tissue sample (biopsy) for testing. How is this treated? There is no cure for this condition, but treatment can help manage it. Goals of treatment include: Helping your skin heal. Reducing itching and inflammation. Slowing the growth of new skin cells. Helping your immune system respond better to your skin. Treatment varies, depending on the severity of your condition. This condition may be treated by: Creams or ointments to help with symptoms.  Ultraviolet ray exposure (light therapy or phototherapy). This may include natural sunlight or light therapy in a medical office. Medicines (systemic therapy). These medicines can help your body better manage skin cell turnover and inflammation. Medicines may be given in the form of  pills or injections. They may be used along with light therapy or ointments. You may also get antibiotic medicines if you have an infection. Follow these instructions at home: [place] your skin as needed. Only use moisturizers that have been approved by your health care provider. Apply cool, wet cloths (cold compresses) to the affected areas. Do not use a hot tub or take hot showers. Take lukewarm showers and baths. Do not scratch your skin. Lifestyle  Do not use any products that contain nicotine or tobacco, such as cigarettes, e-cigarettes, and chewing tobacco. If you need help quitting, ask your health care provider. Use techniques for stress reduction, such as meditation or yoga. Maintain a healthy weight. Follow instructions from your health care provider for weight control. These may include dietary restrictions. Get safe exposure to the sun as told by your health care provider. This may include spending regular intervals of time outdoors in sunlight. Do not get sunburned. Consider joining a psoriasis support group.  Medicines Take or use over-the-counter and prescription medicines only as told by your health care provider. If you were prescribed an antibiotic medicine, take it as told by your health care provider. Do not stop using the antibiotic even if you start to feel better. Alcohol use Limit how much you use: 0-1 drink a day for women. 0-2 drinks a day for men. Be aware of how much alcohol is in your drink. In the [place] one drink equals one 12 oz bottle of beer (355 mL), one 5 oz glass of wine (148 mL), or one 1 oz glass of hard liquor (44 mL). General instructions Keep a journal to help track what triggers an outbreak. Try to avoid any triggers. See a counselor if feelings of sadness, frustration, and hopelessness about your condition are interfering with your work and relationships. Keep all follow-up visits as told by your health care provider. This is  important. Contact a health care provider if: You have a fever. Your pain gets worse. You have increasing redness or warmth in the affected areas. You have new or worsening pain or stiffness in your joints. Your nails start to break easily or pull away from the nail bed. You feel depressed. Summary Psoriasis is a long-term (chronic) skin condition. Patches (plaques) may show up anywhere on your body. There is no cure for this condition, but treatment can help manage it. Treatment varies, depending on the severity of your condition. Keep a journal to track what triggers an outbreak. Try to avoid any triggers. Take or use over-the-counter and prescription medicines only as told by your health care provider. Keep all follow-up visits as told by your health care provider. This is important. This information is not intended to replace advice given to you by your health care provider. Make sure you discuss any questions you have with your healthcare provider. Document Revised: 08/09/2018 Document Reviewed: 08/09/2018 Elsevier Patient Education  2022 [place].  Psoriasis Psoriasis La psoriasis es una afeccin prolongada (crnica) en la piel. Se produce debido a que el sistema inmunitario hace que se formen clulas cutneas con mucha rapidez. En consecuencia, se desarrollan demasiadas clulas cutneas que forman manchas (placas) rojas y con relieve en la piel, que suelen verse plateadas. Las  placas pueden presentarse en cualquier parte del cuerpo. Pueden tener cualquier tamao oforma. Los sntomas de esta afeccin varan de leves a muy graves. La psoriasis no puede transmitirse de [place] persona a otra (no es contagiosa). En algunas ocasiones, los sntomas desaparecen y Teacher, adult education. Cules son las causas? Se desconoce la causa de la psoriasis, pero determinados factores pueden empeorar la afeccin. Estos incluyen los siguientes: Lesiones o traumatismos en la piel, como cortes, raspones,  quemaduras de sol y sequedad. No tener suficiente exposicin a Administrator. Ciertos medicamentos. El alcohol. Su consumo de tabaco. Lev[place] Fresh. Infecciones causadas por bacterias o virus. Qu incrementa el riesgo? Es ms probable que tengan esta afeccin las personas que: Tienen antecedentes familiares de psoriasis. Tienen obesidad. Tiene entre 20 y 67 aos. Estn tomando ciertos medicamentos. Cules son los signos o los sntomas? Existen diferentes tipos de psoriasis. Se puede presentar ms de un tipo de psoriasis a lo largo de la vida. Los tipos son los siguientes: En placas. Este es el ms frecuente. En gotas. Tambin se denomina psoriasis eruptiva. Seborreica. Pustulosa. Eritrodrmica. Sebopsoriasis. Artritis psorisica. Cada tipo de psoriasis presenta sntomas diferentes. Uno de los sntomas de la psoriasis en placas es la presencia de [place] rojas y con relieve, con una cubierta blanca plateada (escama). Estas placas pueden causar picazn. Las uas pueden estar frgiles y [place], o caerse. Uno de los sntomas de la psoriasis en gotas es la presencia de manchas rojas pequeas que a menudo aparecen en el tronco, los brazos y las piernas. Estas manchas pueden desarrollarse despus de una enfermedad, en especial, faringitis estreptoccica. Uno de los sntomas de la psoriasis seborreica es la presencia de placas debajo de las axilas o los senos, o en los genitales, la ingle o las nalgas. Uno de los sntomas de la psoriasis pustulosa es la presencia de los bultos llenos de pus (dolorosos, rojos e hinchados) en las [place] plantas de los pies. Tambin puede sentirse exhausto, afiebrado, dbil o sin apetito. Uno de los sntomas de la psoriasis eritrodrmica es el aspecto rojo brillante en la piel, que puede parecer [place]. Puede sentir los latidos cardacos acelerados y Optometrist corporal muy alta o muy baja. Quizs sienta picazn o dolor. Los sntomas de  la sebopsoriasis incluyen placas rojas que tienen un recubrimiento grasoso y con frecuencia se encuentran en el cuero cabelludo, la frente y [place]. La artritis psorisica causa hinchazn y dolor en las articulaciones, adems de placas escamosas en la piel. Cmo se diagnostica? Esta afeccin se diagnostica en funcin de los sntomas, los antecedentes familiares y un examen fsico. Tambin pueden derivarlo a un mdico especialista en enfermedades de la piel (dermatlogo). El mdico puede extraer Truddie Coco de tejido (biopsia) para analizarla. Cmo se trata? Esta afeccin no tiene [place], pero el tratamiento puede ayudar a Therapist, sports. Entre los [employer] del [place], se incluye lo siguiente: Ayudar a que la piel cicatrice. Reducir la picazn y la inflamacin. Retrasar el desarrollo de nuevas clulas cutneas. Ayudar a que el sistema inmunitario responda mejor y no deteriore la piel. El tratamiento vara, segn la gravedad de la afeccin. El tratamiento para esta afeccin puede incluir lo siguiente: [place] o ungentos para [employer]. Exposicin a rayos ultravioleta (terapia con luz o fototerapia). Esto puede incluir la luz natural del sol o la fototerapia en el consultorio mdico. Medicamentos (tratamiento sistmico). Estos medicamentos pueden ayudar a que el cuerpo controle mejor el desarrollo de las clulas cutneas y la  inflamacin. Pueden darle medicamentos en forma de pldoras o inyecciones. Pueden utilizarse junto con fototerapia o ungentos. Si tiene una infeccin, tambin puede recibir antibiticos. Siga estas instrucciones en su casa: Cuidado de la piel [place] la piel segn sea necesario. Use solamente las cremas humectantes aprobadas por su mdico. Aplique paos fros y hmedos (compresas fras) en las zonas afectadas. No utilice el jacuzzi ni tome duchas de agua caliente. Tome duchas y baos de inmersin tibios. No se rasque la piel. Estilo de vida  No consuma  ningn producto que contenga nicotina o tabaco, como cigarrillos, cigarrillos electrnicos y tabaco de Higher education careers adviser. Si necesita ayuda para dejar de fumar, consulte al mdico. Recurra a tcnicas que reduzcan el estrs, como meditacin o yoga. Mantenga un peso saludable. Siga las instrucciones de su mdico con respecto al control de su peso. Estas pueden incluir restricciones en la dieta. Expngase al sol con precaucin como se lo haya indicado el mdico. Esto puede incluir pasar intervalos regulares al aire libre bajo la luz del sol. No se broncee. Considere participar en un grupo de apoyo para la psoriasis.  Medicamentos Tome o use los medicamentos de venta libre y los recetados solamente como se lo haya indicado el mdico. Si le recetaron un antibitico, tmelo como se lo haya indicado el mdico. No deje de usar el antibitico aunque comience a Sports administrator. Consumo de alcohol Limite la cantidad que consume: De 0 a 1 medida por da para las mujeres. De 0 a 2 medidas por da para los hombres. Est atento a la cantidad de alcohol que hay en las bebidas que toma. En los [place], una medida equivale a una botella de cerveza de 12 oz (355 ml), un vaso de vino de 5 oz (148 ml) o un vaso de una bebida alcohlica de alta graduacin de 1 oz (44 ml). Instrucciones generales Lleve un diario como ayuda para rastrear lo que le desencadena un brote. Intente evitar los factores desencadenantes. Consulte a un consejero si se siente triste, frustrado o desesperanzado con respecto a su afeccin ya que considera que esta interfiere en su trabajo y sus relaciones. Concurra a todas las visitas de seguimiento como se lo haya indicado el mdico. Esto es importante. Comunquese con un mdico si: Tiene fiebre. El dolor [place]. Aumentan el enrojecimiento y Nurse, children's en la zona de la herida. Comienza a Patent attorney o entumecimiento en las articulaciones, o estos sntomas empeoran. Las uas comienzan a romperse  fcilmente o a desprenderse del lecho ungueal. Se siente deprimida. Resumen La psoriasis es una afeccin prolongada (crnica) en la piel. Las manchas (placas) pueden presentarse en cualquier parte del cuerpo. Esta afeccin no tiene [place], pero el tratamiento puede ayudar a Therapist, sports. El tratamiento vara, segn la gravedad de la afeccin. Lleve un diario para Museum/gallery curator lo que le desencadena un brote. Intente evitar los factores desencadenantes. Tome o use los medicamentos de venta libre y los recetados solamente como se lo haya indicado el mdico. Consulting civil engineer a todas las visitas de seguimiento como se lo haya indicado el mdico. Esto es importante. Esta informacin no tiene Marine scientist el consejo del mdico. Asegresede hacerle al mdico cualquier pregunta que tenga. Document Revised: 09/21/2018 Document Reviewed: 09/21/2018 Elsevier Patient Education  [place].

## 2021-05-16 ENCOUNTER — Ambulatory Visit: Payer: Commercial Managed Care - PPO | Admitting: Pharmacist

## 2021-05-16 ENCOUNTER — Telehealth: Payer: Self-pay | Admitting: Internal Medicine

## 2021-05-16 DIAGNOSIS — E119 Type 2 diabetes mellitus without complications: Secondary | ICD-10-CM

## 2021-05-16 DIAGNOSIS — E785 Hyperlipidemia, unspecified: Secondary | ICD-10-CM

## 2021-05-16 NOTE — Telephone Encounter (Signed)
Called patient. See CCM documentation.  

## 2021-05-16 NOTE — Patient Instructions (Signed)
Visit Information   Goals Addressed               This Visit's Progress     Patient Stated     Medication Monitoring (pt-stated)        Patient Goals/Self-Care Activities Over the next 90 days, patient will:  - take medications as prescribed check blood glucose daily, document, and provide at future appointments        Patient verbalizes understanding of instructions provided today and agrees to view in [place].   Plan: Telephone follow up appointment with care management team member scheduled for:  3 days  Catie Darnelle Maffucci, PharmD, [place], [place] Clinical Pharmacist [employer] at [employer] [phone]

## 2021-05-16 NOTE — Telephone Encounter (Signed)
Can you help?

## 2021-05-16 NOTE — Telephone Encounter (Signed)
Patient went to pick up his Semaglutide (RYBELSUS) 7 MG TABShi, with coupon it cost over $500. Is there something else Dr Olivia Mackie can prescribe? Please call patient at [phone].

## 2021-05-16 NOTE — Chronic Care Management (AMB) (Addendum)
Care Management   Pharmacy Note  05/16/2021 Name: Bill Herrera MRN: [id number] DOB: 1980/11/14  Subjective: Bill Herrera is a 40 y.o. year old male who is a primary care patient of McLean-Scocuzza, Nino Glow, MD. The Care Management team was consulted for assistance with care management and care coordination needs.    Engaged with patient by telephone for follow up visit in response to provider referral for pharmacy case management and/or care coordination services.   The patient was given information about Care Management services today including:  Care Management services includes personalized support from designated clinical staff supervised by the patient's primary care provider, including individualized plan of care and coordination with other care providers. 24/7 contact phone numbers for assistance for urgent and routine care needs. The patient may stop case management services at any time by phone call to the office staff.  Patient agreed to services and consent obtained.  Assessment:  Review of patient status, including review of consultants reports, laboratory and other test data, was performed as part of comprehensive evaluation and provision of chronic care management services.   SDOH (Social Determinants of Health) assessments and interventions performed:    Objective:  Lab Results  Component Value Date   CREATININE 0.88 11/22/2020   CREATININE 0.88 01/12/2020   CREATININE 0.82 09/29/2019    Lab Results  Component Value Date   HGBA1C 7.6 (H) 11/22/2020       Component Value Date/Time   CHOL 123 11/22/2020 0934   TRIG 92.0 11/22/2020 0934   HDL 30.60 (L) 11/22/2020 0934   CHOLHDL 4 11/22/2020 0934   VLDL 18.4 11/22/2020 0934   LDLCALC 74 11/22/2020 0934   LDLDIRECT 102.0 01/12/2020 0931   Clinical ASCVD: No  The ASCVD Risk score (Goff DC Jr., et al., 2013) failed to calculate for the following reasons:   The 2013 ASCVD risk score is only valid for ages 40 to 21       BP Readings from Last 3 Encounters:  05/06/21 110/80  04/26/20 118/76  06/30/19 114/73    Care Plan  No Known Allergies  Medications Reviewed Today     Reviewed by Thressa Sheller, CMA (Certified Medical Assistant) on 05/06/21 at 1439  Med List Status: <None>   Medication Order Taking? Sig Documenting Provider Last Dose Status Informant  clobetasol cream (TEMOVATE) 0.05 % [id number] Yes Apply 1 application topically 2 (two) times daily. Right arm abdomen McLean-Scocuzza, Nino Glow, MD Taking Active   fluticasone [facility]) 50 MCG/ACT nasal spray [id number] No Place 2 sprays into both nostrils daily as needed for allergies or rhinitis. After nasal saline  Patient not taking: Reported on 05/06/2021   McLean-Scocuzza, Nino Glow, MD Not Taking Consider Medication Status and Discontinue   glucose blood test strip [id number] Yes Use as instructed E11.9. Accu chek guid test strips check blood sugar in am before breakfast, after lunch and after dinner McLean-Scocuzza, Nino Glow, MD Taking Active   Lancets (ACCU-CHEK SOFT TOUCH) lancets [id number] Yes E 11.9 Use as instructed check blood sugar 3x per day McLean-Scocuzza, Nino Glow, MD Taking Active   miconazole (MICATIN) 2 % cream [id number] Yes Apply 1 application topically 2 (two) times daily. To groin x 1-2 weeks as needed groin area/inner thigh McLean-Scocuzza, Nino Glow, MD Taking Active   pravastatin (PRAVACHOL) 20 MG tablet [id number] Yes Take 1 tablet (20 mg total) by mouth at bedtime. McLean-Scocuzza, Nino Glow, MD Taking Active   Semaglutide [facility]) 7 MG TABS [id number] Yes Take 7 mg by  mouth daily. McLean-Scocuzza, Nino Glow, MD Taking Active   sodium chloride (OCEAN) 0.65 % SOLN nasal spray [id number] No Place 2 sprays into both nostrils as needed for congestion.  Patient not taking: Reported on 05/06/2021   McLean-Scocuzza, Nino Glow, MD Not Taking Consider Medication Status and Discontinue             Patient Active Problem List   Diagnosis  Date Noted   Intertrigo 11/21/2019   Annual physical exam 09/22/2019   Fatty liver 04/13/2019   Vitamin D deficiency 04/07/2019   HLD (hyperlipidemia) 04/07/2019   Elevated liver enzymes 09/23/2018   Dermatitis 09/23/2018   Tinea cruris 09/23/2018   Type 2 diabetes mellitus without complication, without long-term current use of insulin ([place]) 09/23/2018    Conditions to be addressed/monitored: HLD and DMII  Care Plan : Medication Management  Updates made by De Hollingshead, RPH-CPP since 05/16/2021 12:00 AM     Problem: Diabetes, Hyperlipidemia      Long-Range Goal: Disease Progression Prevention   This Visit's Progress: On track  Recent Progress: On track  Priority: High  Note:   Current Barriers:  Unable to independently afford treatment regimen Unable to achieve control of diabetes   Pharmacist Clinical Goal(s):  Over the next 90 days, patient will verbalize ability to afford treatment regimen. Over the next 90 days, patient will achieve control of diabetes as evidenced by improvement in A1c through collaboration with PharmD and provider.   Interventions: Inter-disciplinary care team collaboration (see longitudinal plan of care) Comprehensive medication review performed; medication list updated in electronic medical record  Diabetes: Uncontrolled; current treatment: Rybelsus 7 mg daily Calls today to report that cost has gone up on the medication. He is unaware of any insurance changes. Contacted pharmacy, copay is $500 and this is with savings card. Called patient back, left voicemail advising to call his insurance and see why the copay has changed (possible that an insurance deductible reset). Will provide samples and outreach patient next week to follow up.   Hyperlipidemia: Controlled per last lipid panel; current treatment: pravastatin 20 mg QPM. Previously recommended to continue current regimen at this time.   Patient Goals/Self-Care Activities Over the next  90 days, patient will:  - take medications as prescribed check blood glucose daily, document, and provide at future appointments  Follow Up Plan: Telephone follow up appointment with care management team member scheduled for: ~ 3 days      Medication Assistance:   Navigated cost concern.  Follow Up:  Patient requests no follow-up at this time.  Plan: Telephone follow up appointment with care management team member scheduled for:  3 days  Catie Darnelle Maffucci, PharmD, [place], CPP Clinical Pharmacist [place] at [facility] [phone]  Medication Samples have been provided to the patient.  Drug name: Rybelsus       Strength: 3 mg        Qty: 60  [id number]  Exp.Date: 07/2022

## 2021-05-19 ENCOUNTER — Telehealth: Payer: Self-pay | Admitting: Pharmacist

## 2021-05-19 NOTE — Telephone Encounter (Signed)
  Chronic Care Management   Note  05/19/2021 Name: Bill Herrera MRN: [id number] DOB: 06/26/1981   Attempted to contact patient for medication access question. Left HIPAA compliant message for patient to return my call at their convenience.    Catie Darnelle Maffucci, PharmD, [place], [place] Clinical Pharmacist [employer] at [place]

## 2021-05-22 NOTE — Telephone Encounter (Signed)
  Chronic Care Management   Note  05/22/2021 Name: Bill Herrera MRN: [id number] DOB: 06/16/1981   Attempted to contact patient for scheduled appointment for medication management support. Left HIPAA compliant message for patient to return my call at their convenience.    Catie Darnelle Maffucci, PharmD, [place], [place] Clinical Pharmacist [employer] at [place]

## 2021-05-23 ENCOUNTER — Ambulatory Visit: Payer: Commercial Managed Care - PPO | Admitting: Pharmacist

## 2021-05-23 DIAGNOSIS — E119 Type 2 diabetes mellitus without complications: Secondary | ICD-10-CM

## 2021-05-23 DIAGNOSIS — E785 Hyperlipidemia, unspecified: Secondary | ICD-10-CM

## 2021-05-23 NOTE — Chronic Care Management (AMB) (Signed)
Care Management   Pharmacy Note  05/23/2021 Name: Bill Herrera MRN: [id number] DOB: 1980/12/26  Subjective: Bill Herrera is a 40 y.o. year old male who is a primary care patient of McLean-Scocuzza, Nino Glow, MD. The Care Management team was consulted for assistance with care management and care coordination needs.    Engaged with patient by telephone for follow up visit in response to provider referral for pharmacy case management and/or care coordination services.   The patient was given information about Care Management services today including:  Care Management services includes personalized support from designated clinical staff supervised by the patient's primary care provider, including individualized plan of care and coordination with other care providers. 24/7 contact phone numbers for assistance for urgent and routine care needs. The patient may stop case management services at any time by phone call to the office staff.  Patient agreed to services and consent obtained.  Assessment:  Review of patient status, including review of consultants reports, laboratory and other test data, was performed as part of comprehensive evaluation and provision of chronic care management services.   SDOH (Social Determinants of Health) assessments and interventions performed:    Objective:  Lab Results  Component Value Date   CREATININE 0.88 11/22/2020   CREATININE 0.88 01/12/2020   CREATININE 0.82 09/29/2019    Lab Results  Component Value Date   HGBA1C 7.6 (H) 11/22/2020       Component Value Date/Time   CHOL 123 11/22/2020 0934   TRIG 92.0 11/22/2020 0934   HDL 30.60 (L) 11/22/2020 0934   CHOLHDL 4 11/22/2020 0934   VLDL 18.4 11/22/2020 0934   LDLCALC 74 11/22/2020 0934   LDLDIRECT 102.0 01/12/2020 0931    Clinical ASCVD: No  The ASCVD Risk score (Goff DC Jr., et al., 2013) failed to calculate for the following reasons:   The 2013 ASCVD risk score is only valid for ages 44 to  35      BP Readings from Last 3 Encounters:  05/06/21 110/80  04/26/20 118/76  06/30/19 114/73    Care Plan  No Known Allergies  Medications Reviewed Today     Reviewed by Thressa Sheller, CMA (Certified Medical Assistant) on 05/06/21 at 1439  Med List Status: <None>   Medication Order Taking? Sig Documenting Provider Last Dose Status Informant  clobetasol cream (TEMOVATE) 0.05 % [id number] Yes Apply 1 application topically 2 (two) times daily. Right arm abdomen McLean-Scocuzza, Nino Glow, MD Taking Active   fluticasone [facility]) 50 MCG/ACT nasal spray [id number] No Place 2 sprays into both nostrils daily as needed for allergies or rhinitis. After nasal saline  Patient not taking: Reported on 05/06/2021   McLean-Scocuzza, Nino Glow, MD Not Taking Consider Medication Status and Discontinue   glucose blood test strip [id number] Yes Use as instructed E11.9. Accu chek guid test strips check blood sugar in am before breakfast, after lunch and after dinner McLean-Scocuzza, Nino Glow, MD Taking Active   Lancets (ACCU-CHEK SOFT TOUCH) lancets [id number] Yes E 11.9 Use as instructed check blood sugar 3x per day McLean-Scocuzza, Nino Glow, MD Taking Active   miconazole (MICATIN) 2 % cream [id number] Yes Apply 1 application topically 2 (two) times daily. To groin x 1-2 weeks as needed groin area/inner thigh McLean-Scocuzza, Nino Glow, MD Taking Active   pravastatin (PRAVACHOL) 20 MG tablet [id number] Yes Take 1 tablet (20 mg total) by mouth at bedtime. McLean-Scocuzza, Nino Glow, MD Taking Active   Semaglutide [facility]) 7 MG TABS [id number] Yes Take 7 mg  by mouth daily. McLean-Scocuzza, Nino Glow, MD Taking Active   sodium chloride (OCEAN) 0.65 % SOLN nasal spray [id number] No Place 2 sprays into both nostrils as needed for congestion.  Patient not taking: Reported on 05/06/2021   McLean-Scocuzza, Nino Glow, MD Not Taking Consider Medication Status and Discontinue             Patient Active Problem List    Diagnosis Date Noted   Intertrigo 11/21/2019   Annual physical exam 09/22/2019   Fatty liver 04/13/2019   Vitamin D deficiency 04/07/2019   HLD (hyperlipidemia) 04/07/2019   Elevated liver enzymes 09/23/2018   Dermatitis 09/23/2018   Tinea cruris 09/23/2018   Type 2 diabetes mellitus without complication, without long-term current use of insulin ([place]) 09/23/2018    Conditions to be addressed/monitored: HLD and DMII  Care Plan : Medication Management  Updates made by De Hollingshead, RPH-CPP since 05/23/2021 12:00 AM     Problem: Diabetes, Hyperlipidemia      Long-Range Goal: Disease Progression Prevention   This Visit's Progress: On track  Recent Progress: On track  Priority: High  Note:   Current Barriers:  Unable to independently afford treatment regimen Unable to achieve control of diabetes   Pharmacist Clinical Goal(s):  Over the next 90 days, patient will verbalize ability to afford treatment regimen. Over the next 90 days, patient will achieve control of diabetes as evidenced by improvement in A1c through collaboration with PharmD and provider.   Interventions: Inter-disciplinary care team collaboration (see longitudinal plan of care) Comprehensive medication review performed; medication list updated in electronic medical record  Diabetes: Uncontrolled; current treatment: Rybelsus 7 mg daily Spoke today with patient. He will call his insurance to inquire why copay changed. He is aware to come pick up samples  Hyperlipidemia: Controlled per last lipid panel; current treatment: pravastatin 20 mg QPM. Previously recommended to continue current regimen at this time.   Patient Goals/Self-Care Activities Over the next 90 days, patient will:  - take medications as prescribed check blood glucose daily, document, and provide at future appointments  Follow Up Plan: Telephone follow up appointment with care management team member scheduled for: ~ 3 days       Medication Assistance:   collaborating with patient and insurnace  Follow Up:  Patient agrees to Care Plan and Follow-up.  Plan: Telephone follow up appointment with care management team member scheduled for:  3 days  Catie Darnelle Maffucci, PharmD, [place], [place] Clinical Pharmacist [employer] [facility] [employer] [phone]

## 2021-05-23 NOTE — Telephone Encounter (Signed)
Spoke with patient. See Ccm documentation

## 2021-05-23 NOTE — Patient Instructions (Signed)
Visit Information   Goals Addressed               This Visit's Progress     Patient Stated     Medication Monitoring (pt-stated)        Patient Goals/Self-Care Activities Over the next 90 days, patient will:  - take medications as prescribed check blood glucose daily, document, and provide at future appointments         Patient verbalizes understanding of instructions provided today and agrees to view in [place].    Plan: Telephone follow up appointment with care management team member scheduled for:  3 days  Catie Darnelle Maffucci, PharmD, [place], [place] Clinical Pharmacist [employer] at [employer] [phone]

## 2021-05-23 NOTE — Telephone Encounter (Signed)
Received voicemail from patient that he broke his phone and just got a replacement. Called back, left voicemail for him to return my call at his convenience.

## 2021-05-29 ENCOUNTER — Ambulatory Visit: Payer: Commercial Managed Care - PPO | Admitting: Pharmacist

## 2021-05-29 DIAGNOSIS — E119 Type 2 diabetes mellitus without complications: Secondary | ICD-10-CM

## 2021-05-29 DIAGNOSIS — E785 Hyperlipidemia, unspecified: Secondary | ICD-10-CM

## 2021-05-29 NOTE — Patient Instructions (Signed)
Bill Herrera,   I called [facility] to talk about your pharmacy benefits.   For you, Rybelsus was only allowed to be filled at the local pharmacy for 3 months. On the 4th month, they stopped covering. You can:  1) Get from [facility] mail order - $100 for a 3 month supply. You can't use the savings card with mail order.   OR   2) Call the "[facility]" department at [phone] and request continuing to fill [facility] the [facility]. They can process that request and cost will go back to what it was before - with the card, I think it should be $10 for a 90 day supply.   Let me know if you have any questions!  Catie Darnelle Maffucci, PharmD [phone]  Visit Information   Goals Addressed               This Visit's Progress     Patient Stated     Medication Monitoring (pt-stated)        Patient Goals/Self-Care Activities Over the next 90 days, patient will:  - take medications as prescribed check blood glucose daily, document, and provide at future appointments          Patient verbalizes understanding of instructions provided today and agrees to view in [place].  Plan: Telephone follow up appointment with care management team member scheduled for:  ~ 1 week  Catie Darnelle Maffucci, PharmD, [place], [place] Clinical Pharmacist [employer] at [employer] [phone]

## 2021-05-29 NOTE — Chronic Care Management (AMB) (Signed)
Care Management   Pharmacy Note  05/29/2021 Name: Bill Herrera MRN: [id number] DOB: 07/03/1981  Subjective: Bill Herrera is a 40 y.o. year old male who is a primary care patient of McLean-Scocuzza, Nino Glow, MD. The Care Management team was consulted for assistance with care management and care coordination needs.    Engaged with patient by telephone for  medication access discussion  in response to provider referral for pharmacy case management and/or care coordination services.   The patient was given information about Care Management services today including:  Care Management services includes personalized support from designated clinical staff supervised by the patient's primary care provider, including individualized plan of care and coordination with other care providers. 24/7 contact phone numbers for assistance for urgent and routine care needs. The patient may stop case management services at any time by phone call to the office staff.  Patient agreed to services and consent obtained.  Assessment:  Review of patient status, including review of consultants reports, laboratory and other test data, was performed as part of comprehensive evaluation and provision of chronic care management services.   SDOH (Social Determinants of Health) assessments and interventions performed:    Objective:  Lab Results  Component Value Date   CREATININE 0.88 11/22/2020   CREATININE 0.88 01/12/2020   CREATININE 0.82 09/29/2019    Lab Results  Component Value Date   HGBA1C 7.6 (H) 11/22/2020       Component Value Date/Time   CHOL 123 11/22/2020 0934   TRIG 92.0 11/22/2020 0934   HDL 30.60 (L) 11/22/2020 0934   CHOLHDL 4 11/22/2020 0934   VLDL 18.4 11/22/2020 0934   LDLCALC 74 11/22/2020 0934   LDLDIRECT 102.0 01/12/2020 0931    Clinical ASCVD: No  The ASCVD Risk score (Goff DC Jr., et al., 2013) failed to calculate for the following reasons:   The 2013 ASCVD risk score is only valid  for ages 53 to 94      BP Readings from Last 3 Encounters:  05/06/21 110/80  04/26/20 118/76  06/30/19 114/73    Care Plan  No Known Allergies  Medications Reviewed Today     Reviewed by Thressa Sheller, CMA (Certified Medical Assistant) on 05/06/21 at 1439  Med List Status: <None>   Medication Order Taking? Sig Documenting Provider Last Dose Status Informant  clobetasol cream (TEMOVATE) 0.05 % [id number] Yes Apply 1 application topically 2 (two) times daily. Right arm abdomen McLean-Scocuzza, Nino Glow, MD Taking Active   fluticasone [facility]) 50 MCG/ACT nasal spray [id number] No Place 2 sprays into both nostrils daily as needed for allergies or rhinitis. After nasal saline  Patient not taking: Reported on 05/06/2021   McLean-Scocuzza, Nino Glow, MD Not Taking Consider Medication Status and Discontinue   glucose blood test strip [id number] Yes Use as instructed E11.9. Accu chek guid test strips check blood sugar in am before breakfast, after lunch and after dinner McLean-Scocuzza, Nino Glow, MD Taking Active   Lancets (ACCU-CHEK SOFT TOUCH) lancets [id number] Yes E 11.9 Use as instructed check blood sugar 3x per day McLean-Scocuzza, Nino Glow, MD Taking Active   miconazole (MICATIN) 2 % cream [id number] Yes Apply 1 application topically 2 (two) times daily. To groin x 1-2 weeks as needed groin area/inner thigh McLean-Scocuzza, Nino Glow, MD Taking Active   pravastatin (PRAVACHOL) 20 MG tablet [id number] Yes Take 1 tablet (20 mg total) by mouth at bedtime. McLean-Scocuzza, Nino Glow, MD Taking Active   Semaglutide [facility]) 7 MG TABS [id number] Yes Take  7 mg by mouth daily. McLean-Scocuzza, Nino Glow, MD Taking Active   sodium chloride (OCEAN) 0.65 % SOLN nasal spray [id number] No Place 2 sprays into both nostrils as needed for congestion.  Patient not taking: Reported on 05/06/2021   McLean-Scocuzza, Nino Glow, MD Not Taking Consider Medication Status and Discontinue             Patient Active Problem  List   Diagnosis Date Noted   Intertrigo 11/21/2019   Annual physical exam 09/22/2019   Fatty liver 04/13/2019   Vitamin D deficiency 04/07/2019   HLD (hyperlipidemia) 04/07/2019   Elevated liver enzymes 09/23/2018   Dermatitis 09/23/2018   Tinea cruris 09/23/2018   Type 2 diabetes mellitus without complication, without long-term current use of insulin ([place]) 09/23/2018    Conditions to be addressed/monitored: HLD and DMII  Care Plan : Medication Management  Updates made by De Hollingshead, RPH-CPP since 05/29/2021 12:00 AM     Problem: Diabetes, Hyperlipidemia      Long-Range Goal: Disease Progression Prevention   This Visit's Progress: On track  Recent Progress: On track  Priority: High  Note:   Current Barriers:  Unable to independently afford treatment regimen Unable to achieve control of diabetes   Pharmacist Clinical Goal(s):  Over the next 90 days, patient will verbalize ability to afford treatment regimen. Over the next 90 days, patient will achieve control of diabetes as evidenced by improvement in A1c through collaboration with PharmD and provider.   Interventions: Inter-disciplinary care team collaboration (see longitudinal plan of care) Comprehensive medication review performed; medication list updated in electronic medical record  Diabetes: Uncontrolled; current treatment: Rybelsus 7 mg daily Reports that he spoke with his insurance but didn't understand what they explained about why his medication became more expensive. Contacted [facility]. Per his plan, certain medications only have 3 fills before they require filling at [facility] mail order. Cost would be $100 for 90 day supply, and copay cards cannot be used. However, patient can call the "[facility]" department at [phone] and request to receive his medications at the local pharmacy and he can fill Rybelsus for prior copay. Will relay this information to patient when he is here for labs  tomorrow  Hyperlipidemia: Controlled per last lipid panel; current treatment: pravastatin 20 mg QPM. Previously recommended to continue current regimen at this time. Lab work tomorrow  Patient Goals/Self-Care Activities Over the next 90 days, patient will:  - take medications as prescribed check blood glucose daily, document, and provide at future appointments  Follow Up Plan: Telephone follow up appointment with care management team member scheduled for: ~ 7 days      Medication Assistance:  None required.  Patient affirms current coverage meets needs.  Follow Up:  Patient agrees to Care Plan and Follow-up.  Plan: Telephone follow up appointment with care management team member scheduled for:  ~ 1 week  Catie Darnelle Maffucci, PharmD, [place], [place] Clinical Pharmacist [employer] at [employer] [phone]

## 2021-05-30 ENCOUNTER — Other Ambulatory Visit: Payer: Self-pay

## 2021-05-30 ENCOUNTER — Other Ambulatory Visit (INDEPENDENT_AMBULATORY_CARE_PROVIDER_SITE_OTHER): Payer: Commercial Managed Care - PPO

## 2021-05-30 DIAGNOSIS — E559 Vitamin D deficiency, unspecified: Secondary | ICD-10-CM

## 2021-05-30 DIAGNOSIS — L409 Psoriasis, unspecified: Secondary | ICD-10-CM

## 2021-05-30 DIAGNOSIS — E119 Type 2 diabetes mellitus without complications: Secondary | ICD-10-CM

## 2021-05-30 LAB — CBC WITH DIFFERENTIAL/PLATELET
Basophils Absolute: 0 10*3/uL (ref 0.0–0.1)
Basophils Relative: 0.6 % (ref 0.0–3.0)
Eosinophils Absolute: 0.1 10*3/uL (ref 0.0–0.7)
Eosinophils Relative: 2.3 % (ref 0.0–5.0)
HCT: 42.5 % (ref 39.0–52.0)
Hemoglobin: 14.5 g/dL (ref 13.0–17.0)
Lymphocytes Relative: 32.3 % (ref 12.0–46.0)
Lymphs Abs: 1.8 10*3/uL (ref 0.7–4.0)
MCHC: 34 g/dL (ref 30.0–36.0)
MCV: 86.6 fl (ref 78.0–100.0)
Monocytes Absolute: 0.4 10*3/uL (ref 0.1–1.0)
Monocytes Relative: 7.2 % (ref 3.0–12.0)
Neutro Abs: 3.2 10*3/uL (ref 1.4–7.7)
Neutrophils Relative %: 57.6 % (ref 43.0–77.0)
Platelets: 229 10*3/uL (ref 150.0–400.0)
RBC: 4.91 Mil/uL (ref 4.22–5.81)
RDW: 12.3 % (ref 11.5–15.5)
WBC: 5.6 10*3/uL (ref 4.0–10.5)

## 2021-05-30 LAB — COMPREHENSIVE METABOLIC PANEL
ALT: 38 U/L (ref 0–53)
AST: 25 U/L (ref 0–37)
Albumin: 4.4 g/dL (ref 3.5–5.2)
Alkaline Phosphatase: 102 U/L (ref 39–117)
BUN: 15 mg/dL (ref 6–23)
CO2: 26 mEq/L (ref 19–32)
Calcium: 9.2 mg/dL (ref 8.4–10.5)
Chloride: 101 mEq/L (ref 96–112)
Creatinine, Ser: 0.95 mg/dL (ref 0.40–1.50)
GFR: 101.56 mL/min (ref >60.00)
Glucose, Bld: 251 mg/dL — ABNORMAL HIGH (ref 70–99)
Potassium: 3.8 mEq/L (ref 3.5–5.1)
Sodium: 135 mEq/L (ref 135–145)
Total Bilirubin: 0.6 mg/dL (ref 0.2–1.2)
Total Protein: 7 g/dL (ref 6.0–8.3)

## 2021-05-30 LAB — VITAMIN D 25 HYDROXY (VIT D DEFICIENCY, FRACTURES): VITD: 29.3 ng/mL — ABNORMAL LOW (ref 30.00–100.00)

## 2021-05-30 LAB — LIPID PANEL
Cholesterol: 160 mg/dL (ref 0–200)
HDL: 37.6 mg/dL — ABNORMAL LOW (ref >39.00)
LDL Cholesterol: 94 mg/dL (ref 0–99)
NonHDL: 122.38
Total CHOL/HDL Ratio: 4
Triglycerides: 143 mg/dL (ref 0.0–149.0)
VLDL: 28.6 mg/dL (ref 0.0–40.0)

## 2021-05-30 LAB — HEMOGLOBIN A1C: Hgb A1c MFr Bld: 8.5 % — ABNORMAL HIGH (ref 4.6–6.5)

## 2021-06-04 NOTE — Progress Notes (Signed)
Left message to return call 

## 2021-07-18 ENCOUNTER — Ambulatory Visit: Payer: Commercial Managed Care - PPO | Admitting: Family

## 2021-08-22 ENCOUNTER — Ambulatory Visit: Payer: Commercial Managed Care - PPO | Admitting: Family

## 2021-10-03 ENCOUNTER — Ambulatory Visit: Payer: Commercial Managed Care - PPO | Admitting: Family

## 2021-10-24 ENCOUNTER — Ambulatory Visit: Payer: Commercial Managed Care - PPO | Admitting: Family

## 2021-10-31 ENCOUNTER — Encounter: Payer: Self-pay | Admitting: Internal Medicine

## 2021-10-31 ENCOUNTER — Telehealth: Payer: Self-pay | Admitting: Internal Medicine

## 2021-10-31 ENCOUNTER — Telehealth: Payer: Self-pay | Admitting: Pharmacist

## 2021-10-31 ENCOUNTER — Ambulatory Visit: Payer: Commercial Managed Care - PPO | Admitting: Internal Medicine

## 2021-10-31 ENCOUNTER — Other Ambulatory Visit: Payer: Self-pay

## 2021-10-31 VITALS — BP 110/80 | HR 83 | Temp 98.7°F | Ht 65.0 in | Wt 177.8 lb

## 2021-10-31 DIAGNOSIS — E119 Type 2 diabetes mellitus without complications: Secondary | ICD-10-CM | POA: Diagnosis not present

## 2021-10-31 DIAGNOSIS — Z3009 Encounter for other general counseling and advice on contraception: Secondary | ICD-10-CM | POA: Diagnosis not present

## 2021-10-31 DIAGNOSIS — Z1329 Encounter for screening for other suspected endocrine disorder: Secondary | ICD-10-CM | POA: Diagnosis not present

## 2021-10-31 MED ORDER — RYBELSUS 7 MG PO TABS: 7.0000 mg | ORAL_TABLET | Freq: Every day | ORAL | 3 refills | Status: DC

## 2021-10-31 NOTE — Patient Instructions (Addendum)
Simply lemonade light + unsweet tea   When you call speak to Bill Herrera pharmacist in the office [phone] if you have problems with your medications  Call back for flu shot and think about pneumonia shot prevar 13 this is recommended    vasectomy  Phone Fax E-mail Address  [phone]  [place] [id number]   Let me know if your insurance will cover an eye exam and I will refer to [facility] Dr. Wallace Going   Pneumococcal Conjugate Vaccine (Prevnar 13) Suspension for Injection What is this medication? PNEUMOCOCCAL VACCINE (NEU mo KOK al vak SEEN) is a vaccine used to prevent pneumococcus bacterial infections. These bacteria can cause serious infections like pneumonia, meningitis, and blood infections. This vaccine will lower your chance of getting pneumonia. If you do get pneumonia, it can make your symptoms milder and your illness shorter. This vaccine will not treat an infection and will not cause infection. This vaccine is recommended for infants and young children, adults with certain medical conditions, and adults 79 years or older. This medicine may be used for other purposes; ask your health care provider or pharmacist if you have questions. COMMON BRAND NAME(S): Prevnar, Prevnar 13 What should I tell my care team before I take this medication? They need to know if you have any of these conditions: bleeding problems fever immune system problems an unusual or allergic reaction to pneumococcal vaccine, diphtheria toxoid, other vaccines, latex, other medicines, foods, dyes, or preservatives pregnant or trying to get pregnant breast-feeding How should I use this medication? This vaccine is for injection into a muscle. It is given by a health care professional. A copy of Vaccine Information Statements will be given before each vaccination. Read this sheet carefully each time. The sheet may change frequently. Talk to your pediatrician  regarding the use of this medicine in children. While this drug may be prescribed for children as young as 40 weeks old for selected conditions, precautions do apply. Overdosage: If you think you have taken too much of this medicine contact a poison control center or emergency room at once. NOTE: This medicine is only for you. Do not share this medicine with others. What if I miss a dose? It is important not to miss your dose. Call your doctor or health care professional if you are unable to keep an appointment. What may interact with this medication? medicines for cancer chemotherapy medicines that suppress your immune function steroid medicines like prednisone or cortisone This list may not describe all possible interactions. Give your health care provider a list of all the medicines, herbs, non-prescription drugs, or dietary supplements you use. Also tell them if you smoke, drink alcohol, or use illegal drugs. Some items may interact with your medicine. What should I watch for while using this medication? Mild fever and pain should go away in 3 days or less. Report any unusual symptoms to your doctor or health care professional. What side effects may I notice from receiving this medication? Side effects that you should report to your doctor or health care professional as soon as possible: allergic reactions like skin rash, itching or hives, swelling of the face, lips, or tongue breathing problems confused fast or irregular heartbeat fever over 102 degrees F seizures unusual bleeding or bruising unusual muscle weakness Side effects that usually do not require medical attention (report to your doctor or health care professional if they continue or are bothersome): aches and pains diarrhea  fever of 102 degrees F or less headache irritable loss of appetite pain, tender at site where injected trouble sleeping This list may not describe all possible side effects. Call your doctor for  medical advice about side effects. You may report side effects to FDA at 1-800-FDA-1088. Where should I keep my medication? This does not apply. This vaccine is given in a clinic, pharmacy, doctor's office, or other health care setting and will not be stored at home. NOTE: This sheet is a summary. It may not cover all possible information. If you have questions about this medicine, talk to your doctor, pharmacist, or health care provider.  2022 Elsevier/Gold Standard (2014-07-12 00:00:00)  Vasectomy, Care After This sheet gives you information about how to care for yourself after your procedure. Your health care provider may also give you more specific instructions. If you have problems or questions, contact your health care provider. What can I expect after the procedure? After the procedure, it is common to have: Mild pain, swelling, or discomfort in your scrotum or redness on your scrotum. Some blood coming from your incisions or puncture sites for 1 or 2 days. Blood in your semen. Follow these instructions at home: Medicines Take over-the-counter and prescription medicines only as told by your health care provider. Avoid taking any medicines that contain aspirin or NSAIDs, such as ibuprofen. These medicines can make bleeding worse. Activity For the first 2 days after surgery, avoid physical activity and exercise that requires a lot of energy. Ask your health care provider what activities are safe for you. Do not take part in sports or perform heavy physical labor until your pain has improved, or until your health care provider says it is okay. You may have limits on the amount of weight you can lift as told by your health care provider. Do not ejaculate for at least 1 week after the procedure, or for as long as you are told. You may resume sexual activity 7-10 days after your procedure, or when your health care provider approves. Use a different method of birth control (contraception)  until you have had test results that confirm that there is no sperm in your semen. Scrotal support Use scrotal support, such as a jockstrap or underwear with a supportive pouch, as needed for 1 week after your procedure. If you feel discomfort in your scrotum, you may remove the scrotal support to see if the discomfort is relieved. Sometimes scrotal support can press on the scrotum and cause or worsen discomfort. If your skin gets irritated, you may add some germ-free (sterile), fluffed bandages or a clean washcloth to the scrotal support. Managing pain and swelling If directed, put ice on the affected area. To do this: Put ice in a plastic bag. Place a towel between your skin and the bag. Leave the ice on for 20 minutes, 2-3 times a day. Remove the ice if your skin turns bright red. This is very important. If you cannot feel pain, heat, or cold, you have a greater risk of damage to the area.  General instructions  Check your incisions or puncture sites every day for signs of infection. Check for: Redness, swelling, or more pain. Fluid or blood. Warmth. Pus or a bad smell. Leave stitches (sutures) in place. The sutures will dissolve on their own and do not need to be removed. Keep all follow-up visits. This is important because you will need a test to confirm that there is no sperm in your semen. Multiple ejaculations are  needed to clear out sperm that were beyond the vasectomy site. You will need one test result showing that there is no sperm in your semen before you can resume unprotected sex. This may take 2-4 months after your procedure. If you were given a sedative during the procedure, it can affect you for several hours. Do not drive or operate machinery until your health care provider says that it is safe. Contact a health care provider if: You have redness, swelling, or more pain around an incision or puncture site, or in your scrotum area. You have bleeding from an incision or  puncture site. You have pus or a bad smell coming from an incision or puncture site. You have a fever. An incision or puncture site opens up. Get help right away if: You develop a rash. You have trouble breathing. Summary After your procedure, it is common to have mild pain, swelling, redness, or discomfort in your scrotum. For the first 2 days after surgery, avoid physical activity and exercise that requires a lot of energy. Put ice on the affected area. Leave the ice on for 20 minutes, 2-3 times a day. If you were given a sedative during the procedure, it can affect you for several hours. Do not drive or operate machinery until your health care provider says that it is safe. This information is not intended to replace advice given to you by your health care provider. Make sure you discuss any questions you have with your health care provider. Document Revised: 02/22/2020 Document Reviewed: 02/22/2020 Elsevier Patient Education  2022 [employer].  Vasectomy Vasectomy is a procedure in which the vas deferens is cut and then tied or burned (cauterized). The vas deferens is a tube that carries sperm from the testicle to the part of the body that drains urine from the bladder (urethra). This procedure blocks sperm from going through the vas deferens and penis during ejaculation. This ensures that sperm does not go into the vagina during sex. Vasectomy does not affect sexual desire or performance and does not prevent sexually transmitted infections. Vasectomy is considered a permanent and very effective form of birth control (contraception). The decision to have a vasectomy should not be made during a stressful time, such as after the loss of a pregnancy or a divorce. You and your partner should decide on whether to have a vasectomy when you are sure that you do not want children in the future. Tell a health care provider about: Any allergies you have. All medicines you are taking, including  vitamins, herbs, eye drops, creams, and over-the-counter medicines. Any problems you or family members have had with anesthetic medicines. Any blood disorders you have. Any surgeries you have had. Any medical conditions you have. What are the risks? Generally, this is a safe procedure. However, problems may occur, including: Infection. Bleeding and swelling of the scrotum. The scrotum is the sac that contains the testicles, blood vessels, and structures that help deliver sperm and semen. Allergic reactions to medicines. Failure of the procedure to prevent pregnancy. There is a very small chance that the tied or cauterized ends of the vas deferens may reconnect (recanalization). If this happens, you could still make a woman pregnant. Pain in the scrotum that continues after you heal from the procedure. What happens before the procedure? Medicines Ask your health care provider about: Changing or stopping your regular medicines. This is especially important if you are taking diabetes medicines or blood thinners. Taking medicines such as aspirin and  ibuprofen. These medicines can thin your blood. Do not take these medicines unless your health care provider tells you to take them. Taking over-the-counter medicines, vitamins, herbs, and supplements. You may be told to take a medicine to help you relax (sedative) a few hours before the procedure. General instructions Do not use any products that contain nicotine or tobacco for at least 4 weeks before the procedure. These products include cigarettes, e-cigarettes, and chewing tobacco. If you need help quitting, ask your health care provider. Plan to have a responsible adult take you home from the hospital or clinic. If you will be going home right after the procedure, plan to have a responsible adult care for you for the time you are told. This is important. Ask your health care provider: How your surgery site will be marked. What steps will be  taken to help prevent infection. These steps may include: Removing hair at the surgery site. Washing skin with a germ-killing soap. Taking antibiotic medicine. What happens during the procedure?  You will be given one or more of the following: A sedative, unless you were told to take this a few hours before the procedure. A medicine to numb the area (local anesthetic). Your health care provider will feel, or palpate, for your vas deferens. To reach the vas deferens, one of two methods may be used: A very small incision may be made in your scrotum. A punctured opening may be made in your scrotum, without an incision. Your vas deferens will be pulled out of your scrotum and cut. Then, the vas deferens will be closed in one of two ways: Tied at the ends. Cauterized at the ends to seal them off. The vas deferens will be put back into your scrotum. The incision or puncture opening will be closed with absorbable stitches (sutures). The sutures will eventually dissolve and will not need to be removed after the procedure. The procedure will be repeated on the other side of your scrotum. The procedure may vary among health care providers and hospitals. What happens after the procedure? You will be monitored to make sure that you do not have problems. You will be asked not to ejaculate for at least 1 week after the procedure, or for as long as you are told. You will need to use a different form of contraception for 2-4 months after the procedure, until you have test results confirming that there are no sperm in your semen. You may be given scrotal support to wear, such as a jockstrap or underwear with a supportive pouch. If you were given a sedative during the procedure, it can affect you for several hours. Do not drive or operate machinery until your health care provider says that it is safe. Summary Vasectomy blocks sperm from being released during ejaculation. This procedure is considered a  permanent and very effective form of birth control. Your scrotum will be numbed with medicine (local anesthetic) for the procedure. After the procedure, you will be asked not to ejaculate for at least 1 week, or for as long as you are told. You will also need to use a different form of contraception until your test results confirm that there are no sperm in your semen. This information is not intended to replace advice given to you by your health care provider. Make sure you discuss any questions you have with your health care provider. Document Revised: 02/22/2020 Document Reviewed: 02/22/2020 Elsevier Patient Education  [place].

## 2021-10-31 NOTE — Telephone Encounter (Signed)
Per PCP, PA needed. Completed on Cover My Meds. Start Date:10/18/2021;Coverage End Date:11/17/2022  Called [facility], asked to fill Rybelsus prescription.

## 2021-10-31 NOTE — Telephone Encounter (Signed)
Tamika from [employer] called in stating that Pt requested them to contact his pcp office for PA on medication (Semaglutide (RYBELSUS) 7 MG TABS). Tamika stated that pt has an appt today with Dr. Olivia Mackie at 3:00pm. Elwin Sleight stated that Pt would like to get a head start on the PA before he comes to his appt. Pt requesting callback

## 2021-10-31 NOTE — Progress Notes (Signed)
Chief Complaint  Patient presents with   Follow-up    6 mo/    F/u  1. Dm 2 a1c 8.5 on rybelsus 7 mg qd but not had the medication for 1 month due to cost of $1000-1100 for 30 day supply  Given samples of rybelsus 3 mg 30 pills today to take 2 pills daily [facility]  He has changed his diet  2. Wants referral for vasectomy has 2 kids with girlfriend and son Aleene Davidson is 5 days old    Review of Systems  Constitutional:  Negative for weight loss.  HENT:  Negative for hearing loss.   Eyes:  Negative for blurred vision.  Respiratory:  Negative for shortness of breath.   Cardiovascular:  Negative for chest pain.  Gastrointestinal:  Negative for abdominal pain and blood in stool.  Musculoskeletal:  Negative for back pain.  Skin:  Negative for rash.  Neurological:  Negative for headaches.  Psychiatric/Behavioral:  Negative for depression.   Past Medical History:  Diagnosis Date   Diabetes mellitus without complication (HCC)    Frequent headaches    Lipoma    x4 removed with 12 more as of 09/22/2019   Past Surgical History:  Procedure Laterality Date   right cyst removal     right forehead    Family History  Problem Relation Age of Onset   Asthma Father    Diabetes Father        2   Hypertension Father    Social History   Socioeconomic History   Marital status: Single    Spouse name: Not on file   Number of children: Not on file   Years of education: Not on file   Highest education level: Not on file  Occupational History   Not on file  Tobacco Use   Smoking status: Never   Smokeless tobacco: Never  Substance and Sexual Activity   Alcohol use: Yes    Alcohol/week: 2.0 standard drinks    Types: 2 Cans of beer per week   Drug use: Not Currently   Sexual activity: Yes    Partners: Female  Other Topics Concern   Not on file  Social History Narrative   HS ed    HVAC    11 kids    57 days old son Aleene Davidson as of 10/31/21, emily sophia    Social  Determinants of Health   Financial Resource Strain: Low Risk    Difficulty of Paying Living Expenses: Not hard at all  Food Insecurity: Not on file  Transportation Needs: Not on file  Physical Activity: Not on file  Stress: Not on file  Social Connections: Not on file  Intimate Partner Violence: Not on file   Current Meds  Medication Sig   clobetasol cream (TEMOVATE) 5.40 % Apply 1 application topically 2 (two) times daily. Right arm abdomen   clobetasol cream (TEMOVATE) 9.81 % Apply 1 application topically 2 (two) times daily. Prn. Not face   glucose blood test strip Use as instructed E11.9. Accu chek guid test strips check blood sugar in am before breakfast, after lunch and after dinner   Lancets (ACCU-CHEK SOFT TOUCH) lancets E 11.9 Use as instructed check blood sugar 3x per day   pravastatin (PRAVACHOL) 20 MG tablet Take 1 tablet (20 mg total) by mouth at bedtime.   triamcinolone cream (KENALOG) 0.1 % Apply 1 application topically 2 (two) times daily. Prn   [DISCONTINUED] Semaglutide (RYBELSUS) 7 MG TABS Take  7 mg by mouth daily.   No Known Allergies No results found for this or any previous visit (from the past 2160 hour(s)). Objective  Body mass index is 29.59 kg/m. Wt Readings from Last 3 Encounters:  10/31/21 177 lb 12.8 oz (80.6 kg)  05/06/21 177 lb (80.3 kg)  09/20/20 182 lb (82.6 kg)   Temp Readings from Last 3 Encounters:  10/31/21 98.7 F (37.1 C) (Oral)  05/06/21 99.1 F (37.3 C) (Oral)  04/26/20 98.9 F (37.2 C) (Oral)   BP Readings from Last 3 Encounters:  10/31/21 110/80  05/06/21 110/80  04/26/20 118/76   Pulse Readings from Last 3 Encounters:  10/31/21 83  05/06/21 75  04/26/20 72    Physical Exam Vitals and nursing note reviewed.  Constitutional:      Appearance: Normal appearance. He is well-developed and well-groomed. He is obese.  HENT:     Head: Normocephalic and atraumatic.  Eyes:     Conjunctiva/sclera: Conjunctivae normal.      Pupils: Pupils are equal, round, and reactive to light.  Cardiovascular:     Rate and Rhythm: Normal rate and regular rhythm.     Heart sounds: Normal heart sounds.  Pulmonary:     Effort: Pulmonary effort is normal. No respiratory distress.     Breath sounds: Normal breath sounds.  Abdominal:     Tenderness: There is no abdominal tenderness.  Musculoskeletal:     Lumbar back: Tenderness present. Negative right straight leg raise test and negative left straight leg raise test.  Skin:    General: Skin is warm and moist.  Neurological:     General: No focal deficit present.     Mental Status: He is alert and oriented to person, place, and time. Mental status is at baseline.     Sensory: Sensation is intact.     Motor: Motor function is intact.     Coordination: Coordination is intact.     Gait: Gait is intact. Gait normal.  Psychiatric:        Attention and Perception: Attention and perception normal.        Mood and Affect: Mood and affect normal.        Speech: Speech normal.        Behavior: Behavior normal. Behavior is cooperative.        Thought Content: Thought content normal.        Cognition and Memory: Cognition and memory normal.        Judgment: Judgment normal.    Assessment  Plan  Type 2 diabetes mellitus without complication, without long-term current use of insulin (HCC) - Plan: Semaglutide (RYBELSUS) 7 MG TABS Given samples of rybelsus 3 mg to take 2 pills x #30  Catie came in room with patient today and [facility]  Consider referral [facility] Dr. Wallace Going  Foot exam at f/u   Vasectomy evaluation - Plan: Ambulatory referral to Urology   HM Flu due declines today Consider prevnar declines today sch fasting labs asap 2 or 12/2021 tdap utd  moderna 2/2 consider booster  Consider pna 23 vaccine Consider hep B vaccine new x 2 doses hep A immune; h/o fatty liver HIV negative rec healthy diet and exercise given info  Provider: Dr. Olivia Mackie  [facility]

## 2021-11-03 NOTE — Telephone Encounter (Signed)
October 31, 2021 De Hollingshead, RPH-CPP      2:10 PM Note Per PCP, PA needed. Completed on Cover My Meds. Start Date:10/18/2021;Coverage End Date:11/17/2022   [facility], asked [facility] Rybelsus prescription.

## 2021-11-06 ENCOUNTER — Other Ambulatory Visit: Payer: Self-pay | Admitting: Internal Medicine

## 2021-11-06 DIAGNOSIS — E119 Type 2 diabetes mellitus without complications: Secondary | ICD-10-CM

## 2021-11-07 ENCOUNTER — Ambulatory Visit: Payer: Commercial Managed Care - PPO | Admitting: Internal Medicine

## 2021-11-20 ENCOUNTER — Other Ambulatory Visit: Payer: Self-pay

## 2021-11-20 DIAGNOSIS — E119 Type 2 diabetes mellitus without complications: Secondary | ICD-10-CM

## 2021-11-20 MED ORDER — RYBELSUS 7 MG PO TABS: 7.0000 mg | ORAL_TABLET | Freq: Every day | ORAL | 3 refills | Status: DC

## 2021-12-23 ENCOUNTER — Ambulatory Visit: Payer: Self-pay | Admitting: Pharmacist

## 2021-12-23 NOTE — Chronic Care Management (AMB) (Signed)
?  Chronic Care Management  ? ?Note ? ?12/23/2021 ?Name: Bill Herrera MRN: [id number] DOB: [birth year] ? ? ? ?Closing pharmacy CCM case at this time. . Patient has clinic contact information for future questions or concerns.  ? ?Catie Darnelle Maffucci, PharmD, [place], CPP ?Clinical Pharmacist ?Therapist, music at [employer] ?[phone] ? ?

## 2022-01-12 ENCOUNTER — Encounter: Payer: Self-pay | Admitting: *Deleted

## 2022-02-15 ENCOUNTER — Other Ambulatory Visit: Payer: Self-pay | Admitting: Internal Medicine

## 2022-02-15 DIAGNOSIS — E1165 Type 2 diabetes mellitus with hyperglycemia: Secondary | ICD-10-CM

## 2022-05-09 ENCOUNTER — Other Ambulatory Visit: Payer: Self-pay | Admitting: Internal Medicine

## 2022-05-09 DIAGNOSIS — L409 Psoriasis, unspecified: Secondary | ICD-10-CM

## 2023-01-01 ENCOUNTER — Encounter: Payer: Self-pay | Admitting: Nurse Practitioner

## 2023-01-01 ENCOUNTER — Ambulatory Visit: Payer: Commercial Managed Care - PPO | Admitting: Nurse Practitioner

## 2023-01-01 VITALS — BP 115/70 | HR 75 | Temp 97.9°F | Ht 65.0 in | Wt 169.4 lb

## 2023-01-01 DIAGNOSIS — M2540 Effusion, unspecified joint: Secondary | ICD-10-CM

## 2023-01-01 DIAGNOSIS — E119 Type 2 diabetes mellitus without complications: Secondary | ICD-10-CM

## 2023-01-01 DIAGNOSIS — L409 Psoriasis, unspecified: Secondary | ICD-10-CM | POA: Diagnosis not present

## 2023-01-01 DIAGNOSIS — E785 Hyperlipidemia, unspecified: Secondary | ICD-10-CM

## 2023-01-01 LAB — CBC WITH DIFFERENTIAL/PLATELET
Basophils Absolute: 0 10*3/uL (ref 0.0–0.1)
Basophils Relative: 0.5 % (ref 0.0–3.0)
Eosinophils Absolute: 0.1 10*3/uL (ref 0.0–0.7)
Eosinophils Relative: 2.7 % (ref 0.0–5.0)
HCT: 42.5 % (ref 39.0–52.0)
Hemoglobin: 14.6 g/dL (ref 13.0–17.0)
Lymphocytes Relative: 30.6 % (ref 12.0–46.0)
Lymphs Abs: 1.6 10*3/uL (ref 0.7–4.0)
MCHC: 34.3 g/dL (ref 30.0–36.0)
MCV: 86.2 fl (ref 78.0–100.0)
Monocytes Absolute: 0.3 10*3/uL (ref 0.1–1.0)
Monocytes Relative: 6.2 % (ref 3.0–12.0)
Neutro Abs: 3.2 10*3/uL (ref 1.4–7.7)
Neutrophils Relative %: 60 % (ref 43.0–77.0)
Platelets: 319 10*3/uL (ref 150.0–400.0)
RBC: 4.93 Mil/uL (ref 4.22–5.81)
RDW: 12.7 % (ref 11.5–15.5)
WBC: 5.3 10*3/uL (ref 4.0–10.5)

## 2023-01-01 LAB — COMPREHENSIVE METABOLIC PANEL
ALT: 34 U/L (ref 0–53)
AST: 20 U/L (ref 0–37)
Albumin: 4.3 g/dL (ref 3.5–5.2)
Alkaline Phosphatase: 112 U/L (ref 39–117)
BUN: 12 mg/dL (ref 6–23)
CO2: 26 mEq/L (ref 19–32)
Calcium: 9.4 mg/dL (ref 8.4–10.5)
Chloride: 103 mEq/L (ref 96–112)
Creatinine, Ser: 0.77 mg/dL (ref 0.40–1.50)
GFR: 112.34 mL/min (ref >60.00)
Glucose, Bld: 138 mg/dL — ABNORMAL HIGH (ref 70–99)
Potassium: 4 mEq/L (ref 3.5–5.1)
Sodium: 136 mEq/L (ref 135–145)
Total Bilirubin: 0.5 mg/dL (ref 0.2–1.2)
Total Protein: 6.9 g/dL (ref 6.0–8.3)

## 2023-01-01 LAB — LIPID PANEL
Cholesterol: 136 mg/dL (ref 0–200)
HDL: 29 mg/dL — ABNORMAL LOW (ref >39.00)
NonHDL: 107.47
Total CHOL/HDL Ratio: 5
Triglycerides: 227 mg/dL — ABNORMAL HIGH (ref 0.0–149.0)
VLDL: 45.4 mg/dL — ABNORMAL HIGH (ref 0.0–40.0)

## 2023-01-01 LAB — TSH: TSH: 1.02 u[IU]/mL (ref 0.35–5.50)

## 2023-01-01 LAB — C-REACTIVE PROTEIN: CRP: <1 mg/dL (ref 0.5–20.0)

## 2023-01-01 LAB — SEDIMENTATION RATE: Sed Rate: 25 mm/hr — ABNORMAL HIGH (ref 0–15)

## 2023-01-01 LAB — HEMOGLOBIN A1C: Hgb A1c MFr Bld: 8.7 % — ABNORMAL HIGH (ref 4.6–6.5)

## 2023-01-01 LAB — LDL CHOLESTEROL, DIRECT: Direct LDL: 78 mg/dL

## 2023-01-01 NOTE — Progress Notes (Unsigned)
Established Patient Office Visit  Subjective:  Patient ID: Bill Herrera, male    DOB: 17-Jan-1981  Age: 41 y.o. MRN: [id number]  CC:  Chief Complaint  Patient presents with   Transitions Of Care    HPI  Bill Herrera presents for transition of care. His previous PCP was Dr. Olivia Mackie.   He has h/o diabetes, psoriasis, headaches.   He has plaque psoriasis  and swollen left  and right 2nd MCP joint. He also complain of pain in the elbow and knee.   He is followed by dermatologist and he has an appointment scheduled with the rheumatologist.      HPI   Past Medical History:  Diagnosis Date   Diabetes mellitus without complication ([place])    Frequent headaches    Lipoma    x4 removed with 12 more as of 09/22/2019    Past Surgical History:  Procedure Laterality Date   right cyst removal     right forehead     Family History  Problem Relation Age of Onset   Asthma Father    Diabetes Father        2   Hypertension Father     Social History   Socioeconomic History   Marital status: Single    Spouse name: Not on file   Number of children: Not on file   Years of education: Not on file   Highest education level: Not on file  Occupational History   Not on file  Tobacco Use   Smoking status: Never   Smokeless tobacco: Never  Substance and Sexual Activity   Alcohol use: Yes    Alcohol/week: 2.0 standard drinks of alcohol    Types: 2 Cans of beer per week   Drug use: Not Currently   Sexual activity: Yes    Partners: Female  Other Topics Concern   Not on file  Social History Narrative   HS ed    HVAC    68 kids    40 days old son Aleene Davidson as of 10/31/21, emily sophia    Social Determinants of Health   Financial Resource Strain: Low Risk  (12/04/2020)   Overall Financial Resource Strain (CARDIA)    Difficulty of Paying Living Expenses: Not hard at all  Recent Concern: Financial Resource Strain - Medium Risk (09/27/2020)   Overall Financial Resource Strain (CARDIA)     Difficulty of Paying Living Expenses: Somewhat hard  Food Insecurity: Not on file  Transportation Needs: Not on file  Physical Activity: Not on file  Stress: Not on file  Social Connections: Not on file  Intimate Partner Violence: Not on file     Outpatient Medications Prior to Visit  Medication Sig Dispense Refill   Apremilast (OTEZLA) 10 & 20 & 30 MG TBPK Take according to the starter pack instructions. After finishing the start pack, switch to the maintenance dose     Apremilast (OTEZLA) 30 MG TABS Take by mouth.     clobetasol cream (TEMOVATE) [id number] % Apply 1 application topically 2 (two) times daily. Right arm abdomen 60 g 2   clobetasol cream (TEMOVATE) [id number] % APPLY 1 APPLICATION TOPICALLY 2 (TWO) TIMES DAILY. AS NEEDED. NOT FACE 60 g 2   glucose blood test strip Use as instructed E11.9. Accu chek guid test strips check blood sugar in am before breakfast, after lunch and after dinner 300 each 12   Lancets (ACCU-CHEK SOFT TOUCH) lancets E 11.9 Use as instructed check blood sugar 3x per day 300  each 12   pravastatin (PRAVACHOL) 20 MG tablet TAKE 1 TABLET BY MOUTH EVERYDAY AT BEDTIME 30 tablet 11   Semaglutide (RYBELSUS) 7 MG TABS Take 7 mg by mouth daily. 90 tablet 3   triamcinolone cream (KENALOG) 0.1 % Apply 1 application topically 2 (two) times daily. Prn 454 g 1   No facility-administered medications prior to visit.    No Known Allergies  ROS Review of Systems  Constitutional: Negative.   HENT: Negative.    Respiratory:  Negative for cough and shortness of breath.   Gastrointestinal: Negative.   Genitourinary: Negative.   Musculoskeletal:  Positive for joint swelling. Negative for back pain.  Skin:  Positive for rash (lower extremitiy, abdomen, and scalp).  Neurological: Negative.   Psychiatric/Behavioral: Negative.        Objective:    Physical Exam Constitutional:      Appearance: Normal appearance. He is normal weight.  HENT:     Head: Normocephalic and  atraumatic.     Right Ear: Tympanic membrane normal.     Left Ear: Tympanic membrane normal.     Nose: Nose normal.     Mouth/Throat:     Mouth: Mucous membranes are moist.     Pharynx: Oropharynx is clear.  Eyes:     Conjunctiva/sclera: Conjunctivae normal.     Pupils: Pupils are equal, round, and reactive to light.  Cardiovascular:     Rate and Rhythm: Normal rate and regular rhythm.     Pulses: Normal pulses.     Heart sounds: Normal heart sounds.  Pulmonary:     Effort: Pulmonary effort is normal.     Breath sounds: Normal breath sounds.  Abdominal:     General: Abdomen is flat. Bowel sounds are normal.     Palpations: Abdomen is soft.     Tenderness: There is no abdominal tenderness.     Hernia: No hernia is present.  Musculoskeletal:        General: Normal range of motion.  Skin:    General: Skin is warm.     Coloration: Skin is not jaundiced.     Findings: Rash (crusty, erthmeotus) present. No erythema.  Neurological:     General: No focal deficit present.     Mental Status: He is alert and oriented to person, place, and time. Mental status is at baseline.  Psychiatric:        Mood and Affect: Mood normal.        Behavior: Behavior normal.        Thought Content: Thought content normal.        Judgment: Judgment normal.     BP 115/70   Pulse 75   Temp 97.9 F (36.6 C) (Oral)   Ht 5\' 5"  (1.651 m)   Wt 169 lb 6.4 oz (76.8 kg)   SpO2 97%   BMI 28.19 kg/m  Wt Readings from Last 3 Encounters:  01/01/23 169 lb 6.4 oz (76.8 kg)  10/31/21 177 lb 12.8 oz (80.6 kg)  05/06/21 177 lb (80.3 kg)     Health Maintenance  Topic Date Due   OPHTHALMOLOGY EXAM  Never done   FOOT EXAM  04/26/2021   Diabetic kidney evaluation - Urine ACR  11/22/2021   HEMOGLOBIN A1C  11/30/2021   Diabetic kidney evaluation - eGFR measurement  05/30/2022   INFLUENZA VACCINE  01/17/2023 (Originally 05/19/2022)   COVID-19 Vaccine (3 - 2023-24 season) 01/17/2023 (Originally 06/19/2022)    DTaP/Tdap/Td (2 - Td or Tdap) 09/23/2028  Hepatitis C Screening  Completed   HIV Screening  Completed   HPV VACCINES  Aged Out    There are no preventive care reminders to display for this patient.  Lab Results  Component Value Date   TSH 0.95 11/22/2020   Lab Results  Component Value Date   WBC 5.6 05/30/2021   HGB 14.5 05/30/2021   HCT 42.5 05/30/2021   MCV 86.6 05/30/2021   PLT 229.0 05/30/2021   Lab Results  Component Value Date   NA 135 05/30/2021   K 3.8 05/30/2021   CO2 26 05/30/2021   GLUCOSE 251 (H) 05/30/2021   BUN 15 05/30/2021   CREATININE 0.95 05/30/2021   BILITOT 0.6 05/30/2021   ALKPHOS 102 05/30/2021   AST 25 05/30/2021   ALT 38 05/30/2021   PROT 7.0 05/30/2021   ALBUMIN 4.4 05/30/2021   CALCIUM 9.2 05/30/2021   GFR 101.56 05/30/2021   Lab Results  Component Value Date   CHOL 160 05/30/2021   Lab Results  Component Value Date   HDL 37.60 (L) 05/30/2021   Lab Results  Component Value Date   LDLCALC 94 05/30/2021   Lab Results  Component Value Date   TRIG 143.0 05/30/2021   Lab Results  Component Value Date   CHOLHDL 4 05/30/2021   Lab Results  Component Value Date   HGBA1C 8.5 (H) 05/30/2021      Assessment & Plan:  Type 2 diabetes mellitus without complication, without long-term current use of insulin (HCC) -     Lipid panel -     Hemoglobin A1c -     POCT UA - Microalbumin  Hyperlipidemia, unspecified hyperlipidemia type -     CBC with Differential/Platelet -     Comprehensive metabolic panel -     Lipid panel -     TSH  Psoriasis -     C-reactive protein -     Sedimentation rate    Follow-up: Return in about 4 months (around 05/03/2023) for chronic management.   Theresia Lo, NP

## 2023-01-01 NOTE — Patient Instructions (Addendum)
We will do labs today. Call with the results. Routine care. Follow up in 4 months

## 2023-01-05 DIAGNOSIS — M2540 Effusion, unspecified joint: Secondary | ICD-10-CM | POA: Insufficient documentation

## 2023-01-05 DIAGNOSIS — L409 Psoriasis, unspecified: Secondary | ICD-10-CM | POA: Insufficient documentation

## 2023-01-05 NOTE — Assessment & Plan Note (Signed)
Cholesterol 160, tried astride 143, HDL 37, LDL 94 on 05/30/2021. Advised to manage diet and follow regular exercise schedule. Labs ordered.

## 2023-01-05 NOTE — Assessment & Plan Note (Signed)
His previous hemoglobin A1c was 8.8.5 on 05/30/2021.  He does not check blood sugars at home. He is on Rybelsus 7 mg daily. Will check hemoglobin A1c today.

## 2023-01-05 NOTE — Assessment & Plan Note (Signed)
Followed by dermatologist. On clobetasol cream apremilast tablet.

## 2023-02-04 ENCOUNTER — Telehealth: Payer: Self-pay | Admitting: Nurse Practitioner

## 2023-02-04 DIAGNOSIS — E119 Type 2 diabetes mellitus without complications: Secondary | ICD-10-CM

## 2023-02-04 NOTE — Telephone Encounter (Deleted)
error 

## 2023-02-12 MED ORDER — RYBELSUS 7 MG PO TABS: 7.0000 mg | ORAL_TABLET | Freq: Every day | ORAL | 3 refills | Status: DC

## 2023-02-12 NOTE — Telephone Encounter (Signed)
Pt need a refill on [facility]US [facility]

## 2023-02-19 NOTE — Telephone Encounter (Signed)
Called and let pt know that the medication had been [facility] not [facility].  Offered to [facility]. He said that he would check on the cost and let us know.

## 2023-02-19 NOTE — Telephone Encounter (Signed)
Pt called in to check status of previous message. As per pt, he really need his med. He does not have any at the moment.

## 2023-02-22 MED ORDER — RYBELSUS 7 MG PO TABS: 7.0000 mg | ORAL_TABLET | Freq: Every day | ORAL | 3 refills | Status: DC

## 2023-02-22 NOTE — Addendum Note (Signed)
Addended by: Donavan Foil on: 02/22/2023 04:02 PM   Modules accepted: Orders

## 2023-02-22 NOTE — Telephone Encounter (Signed)
Rybelsus has been sent to [facility] per pt request

## 2023-02-22 NOTE — Telephone Encounter (Signed)
Pt called back about Rybelsus. As per pt, its cheaper through [facility]. So he would like to be sent over to them.

## 2023-03-13 ENCOUNTER — Telehealth: Payer: Self-pay

## 2023-03-13 ENCOUNTER — Other Ambulatory Visit (HOSPITAL_COMMUNITY): Payer: Self-pay

## 2023-03-13 NOTE — Telephone Encounter (Signed)
Received fax in Onbase that Rybelsus PA was denied due to unanswered clinical questions. Submitted new PA through [facility] and received approval:  Pharmacy Patient Advocate Encounter  Prior Authorization for Rybelsus has been approved by [facility] (ins).    PA # [id number] Effective dates: 02/11/2023 through 03/12/2024

## 2023-05-07 ENCOUNTER — Ambulatory Visit: Payer: Commercial Managed Care - PPO | Admitting: Nurse Practitioner

## 2023-10-01 ENCOUNTER — Telehealth: Payer: Self-pay | Admitting: Nurse Practitioner

## 2023-10-01 NOTE — Telephone Encounter (Signed)
Noted  

## 2023-10-29 ENCOUNTER — Encounter: Payer: Self-pay | Admitting: Nurse Practitioner

## 2023-10-29 ENCOUNTER — Ambulatory Visit: Payer: Commercial Managed Care - PPO | Admitting: Nurse Practitioner

## 2023-10-29 VITALS — BP 100/70 | HR 80 | Temp 97.9°F | Resp 17 | Ht 65.0 in | Wt 169.8 lb

## 2023-10-29 DIAGNOSIS — I8393 Asymptomatic varicose veins of bilateral lower extremities: Secondary | ICD-10-CM | POA: Diagnosis not present

## 2023-10-29 DIAGNOSIS — E785 Hyperlipidemia, unspecified: Secondary | ICD-10-CM | POA: Diagnosis not present

## 2023-10-29 DIAGNOSIS — E1165 Type 2 diabetes mellitus with hyperglycemia: Secondary | ICD-10-CM

## 2023-10-29 DIAGNOSIS — E119 Type 2 diabetes mellitus without complications: Secondary | ICD-10-CM

## 2023-10-29 DIAGNOSIS — Z7984 Long term (current) use of oral hypoglycemic drugs: Secondary | ICD-10-CM

## 2023-10-29 DIAGNOSIS — L309 Dermatitis, unspecified: Secondary | ICD-10-CM

## 2023-10-29 DIAGNOSIS — L409 Psoriasis, unspecified: Secondary | ICD-10-CM | POA: Diagnosis not present

## 2023-10-29 LAB — CBC WITH DIFFERENTIAL/PLATELET
Basophils Absolute: 0 10*3/uL (ref 0.0–0.1)
Basophils Relative: 0.4 % (ref 0.0–3.0)
Eosinophils Absolute: 0.1 10*3/uL (ref 0.0–0.7)
Eosinophils Relative: 1.5 % (ref 0.0–5.0)
HCT: 44.3 % (ref 39.0–52.0)
Hemoglobin: 14.9 g/dL (ref 13.0–17.0)
Lymphocytes Relative: 24.3 % (ref 12.0–46.0)
Lymphs Abs: 1.9 10*3/uL (ref 0.7–4.0)
MCHC: 33.6 g/dL (ref 30.0–36.0)
MCV: 88.8 fL (ref 78.0–100.0)
Monocytes Absolute: 0.5 10*3/uL (ref 0.1–1.0)
Monocytes Relative: 7.2 % (ref 3.0–12.0)
Neutro Abs: 5.1 10*3/uL (ref 1.4–7.7)
Neutrophils Relative %: 66.6 % (ref 43.0–77.0)
Platelets: 338 10*3/uL (ref 150.0–400.0)
RBC: 4.99 Mil/uL (ref 4.22–5.81)
RDW: 12.6 % (ref 11.5–15.5)
WBC: 7.6 10*3/uL (ref 4.0–10.5)

## 2023-10-29 LAB — HEMOGLOBIN A1C: Hgb A1c MFr Bld: 8.1 % — ABNORMAL HIGH (ref 4.6–6.5)

## 2023-10-29 LAB — MICROALBUMIN / CREATININE URINE RATIO
Creatinine,U: 211.5 mg/dL
Microalb Creat Ratio: 1.8 mg/g (ref 0.0–30.0)
Microalb, Ur: 3.8 mg/dL — ABNORMAL HIGH (ref 0.0–1.9)

## 2023-10-29 LAB — COMPREHENSIVE METABOLIC PANEL
ALT: 21 U/L (ref 0–53)
AST: 17 U/L (ref 0–37)
Albumin: 4.6 g/dL (ref 3.5–5.2)
Alkaline Phosphatase: 94 U/L (ref 39–117)
BUN: 12 mg/dL (ref 6–23)
CO2: 29 meq/L (ref 19–32)
Calcium: 9.8 mg/dL (ref 8.4–10.5)
Chloride: 101 meq/L (ref 96–112)
Creatinine, Ser: 0.84 mg/dL (ref 0.40–1.50)
GFR: 108.79 mL/min (ref >60.00)
Glucose, Bld: 110 mg/dL — ABNORMAL HIGH (ref 70–99)
Potassium: 4 meq/L (ref 3.5–5.1)
Sodium: 139 meq/L (ref 135–145)
Total Bilirubin: 0.6 mg/dL (ref 0.2–1.2)
Total Protein: 7.3 g/dL (ref 6.0–8.3)

## 2023-10-29 LAB — LIPID PANEL
Cholesterol: 176 mg/dL (ref 0–200)
HDL: 31.1 mg/dL — ABNORMAL LOW (ref >39.00)
LDL Cholesterol: 94 mg/dL (ref 0–99)
NonHDL: 144.5
Total CHOL/HDL Ratio: 6
Triglycerides: 255 mg/dL — ABNORMAL HIGH (ref 0.0–149.0)
VLDL: 51 mg/dL — ABNORMAL HIGH (ref 0.0–40.0)

## 2023-10-29 MED ORDER — PRAVASTATIN SODIUM 20 MG PO TABS: 20.0000 mg | ORAL_TABLET | Freq: Every day | ORAL | 2 refills | Status: DC

## 2023-10-29 MED ORDER — CLOBETASOL PROPIONATE 0.05 % EX CREA: 1.0000 | TOPICAL_CREAM | Freq: Two times a day (BID) | CUTANEOUS | 2 refills | Status: DC

## 2023-10-29 NOTE — Patient Instructions (Signed)
 Please go to the lab for blood work

## 2023-10-29 NOTE — Progress Notes (Signed)
 Established Patient Office Visit  Subjective:  Patient ID: Bill Herrera, male    DOB: Jan 20, 1981  Age: 43 y.o. MRN: [id number]  CC:  Chief Complaint  Patient presents with   Medical Management of Chronic Issues    Diabetic f/u    HPI  Bill Herrera with a history of diabetes, hyperlipidemia and psoriasis  Diabetes: Reports no significant changes in his diabetic symptoms. States  not checking his blood sugar daily, but twice a week, and reports the readings as normal, although he cannot recall specific numbers. He confirms adherence to Rybelsus .  Psoriasis:  Concern about new skin spots related to his psoriasis. Despite being on oral medication for psoriasis, the patient notes minimal improvement and mentions a potential switch to injectable treatment as discussed with his dermatologist.  Hyperlipidemia: Works in holiday representative and has been trying to eat healthier, incorporating more salads into his diet.  The patient also reports concerns about visible veins on his legs. Denies any numbness or tingling sensation in his feet.   HPI   Past Medical History:  Diagnosis Date   Diabetes mellitus without complication (HCC)    Frequent headaches    Lipoma    x4 removed with 12 more as of 09/22/2019    Past Surgical History:  Procedure Laterality Date   right cyst removal     right forehead     Family History  Problem Relation Age of Onset   Asthma Father    Diabetes Father        2   Hypertension Father     Social History   Socioeconomic History   Marital status: Single    Spouse name: Not on file   Number of children: Not on file   Years of education: Not on file   Highest education level: Not on file  Occupational History   Not on file  Tobacco Use   Smoking status: Never   Smokeless tobacco: Never  Substance and Sexual Activity   Alcohol use: Yes    Alcohol/week: 2.0 standard drinks of alcohol    Types: 2 Cans of beer per week   Drug use: Not Currently    Sexual activity: Yes    Partners: Female  Other Topics Concern   Not on file  Social History Narrative   HS ed    HVAC    71 kids    15 days old son Olita as of 10/31/21, emily sophia    Social Drivers of Corporate Investment Banker Strain: Low Risk  (12/04/2020)   Overall Financial Resource Strain (CARDIA)    Difficulty of Paying Living Expenses: Not hard at all  Recent Concern: Financial Resource Strain - Medium Risk (09/27/2020)   Overall Financial Resource Strain (CARDIA)    Difficulty of Paying Living Expenses: Somewhat hard  Food Insecurity: Not on file  Transportation Needs: Not on file  Physical Activity: Not on file  Stress: Not on file  Social Connections: Not on file  Intimate Partner Violence: Not on file     Outpatient Medications Prior to Visit  Medication Sig Dispense Refill   Apremilast (OTEZLA) 30 MG TABS Take by mouth.     glucose blood test strip Use as instructed E11.9. Accu chek guid test strips check blood sugar in am before breakfast, after lunch and after dinner 300 each 12   Lancets (ACCU-CHEK SOFT TOUCH) lancets E 11.9 Use as instructed check blood sugar 3x per day 300 each 12   Semaglutide  (RYBELSUS ) 7  MG TABS Take 1 tablet (7 mg total) by mouth daily. 90 tablet 3   clobetasol  cream (TEMOVATE ) 0.05 % Apply 1 application topically 2 (two) times daily. Right arm abdomen 60 g 2   pravastatin  (PRAVACHOL ) 20 MG tablet TAKE 1 TABLET BY MOUTH EVERYDAY AT BEDTIME 30 tablet 11   triamcinolone  cream (KENALOG ) 0.1 % Apply 1 application topically 2 (two) times daily. Prn (Patient not taking: Reported on 10/29/2023) 454 g 1   Apremilast (OTEZLA) 10 & 20 & 30 MG TBPK Take according to the starter pack instructions. After finishing the start pack, switch to the maintenance dose     clobetasol  cream (TEMOVATE ) 0.05 % APPLY 1 APPLICATION TOPICALLY 2 (TWO) TIMES DAILY. AS NEEDED. NOT FACE 60 g 2   No facility-administered medications prior to visit.    No Known  Allergies  ROS Review of Systems Negative unless indicated in HPI.    Objective:    Physical Exam Constitutional:      Appearance: Normal appearance.  HENT:     Right Ear: Tympanic membrane normal.     Left Ear: Tympanic membrane normal.     Mouth/Throat:     Mouth: Mucous membranes are moist.  Eyes:     Conjunctiva/sclera: Conjunctivae normal.     Pupils: Pupils are equal, round, and reactive to light.  Cardiovascular:     Rate and Rhythm: Normal rate and regular rhythm.     Pulses: Normal pulses.     Heart sounds: Normal heart sounds.  Pulmonary:     Effort: Pulmonary effort is normal.     Breath sounds: Normal breath sounds.  Musculoskeletal:     Cervical back: Normal range of motion. No tenderness.  Skin:    General: Skin is warm.     Findings: Lesion present.  Neurological:     General: No focal deficit present.     Mental Status: He is alert and oriented to person, place, and time. Mental status is at baseline.  Psychiatric:        Mood and Affect: Mood normal.        Behavior: Behavior normal.        Thought Content: Thought content normal.        Judgment: Judgment normal.     BP 100/70   Pulse 80   Temp 97.9 F (36.6 C) (Oral)   Resp 17   Ht 5' 5 (1.651 m)   Wt 169 lb 12 oz (77 kg)   SpO2 100%   BMI 28.25 kg/m  Wt Readings from Last 3 Encounters:  10/29/23 169 lb 12 oz (77 kg)  01/01/23 169 lb 6.4 oz (76.8 kg)  10/31/21 177 lb 12.8 oz (80.6 kg)     Health Maintenance  Topic Date Due   Pneumococcal Vaccine 69-82 Years old (1 of 2 - PCV) Never done   OPHTHALMOLOGY EXAM  Never done   COVID-19 Vaccine (3 - Moderna risk series) 11/12/2023 (Originally 04/27/2020)   INFLUENZA VACCINE  01/17/2024 (Originally 05/20/2023)   HEMOGLOBIN A1C  04/27/2024   Diabetic kidney evaluation - eGFR measurement  10/28/2024   Diabetic kidney evaluation - Urine ACR  10/28/2024   FOOT EXAM  10/28/2024   DTaP/Tdap/Td (2 - Td or Tdap) 09/23/2028   Hepatitis C  Screening  Completed   HIV Screening  Completed   HPV VACCINES  Aged Out    There are no preventive care reminders to display for this patient.  Lab Results  Component Value Date  TSH 1.02 01/01/2023   Lab Results  Component Value Date   WBC 7.6 10/29/2023   HGB 14.9 10/29/2023   HCT 44.3 10/29/2023   MCV 88.8 10/29/2023   PLT 338.0 10/29/2023   Lab Results  Component Value Date   NA 139 10/29/2023   K 4.0 10/29/2023   CO2 29 10/29/2023   GLUCOSE 110 (H) 10/29/2023   BUN 12 10/29/2023   CREATININE 0.84 10/29/2023   BILITOT 0.6 10/29/2023   ALKPHOS 94 10/29/2023   AST 17 10/29/2023   ALT 21 10/29/2023   PROT 7.3 10/29/2023   ALBUMIN 4.6 10/29/2023   CALCIUM 9.8 10/29/2023   GFR 108.79 10/29/2023   Lab Results  Component Value Date   CHOL 176 10/29/2023   Lab Results  Component Value Date   HDL 31.10 (L) 10/29/2023   Lab Results  Component Value Date   LDLCALC 94 10/29/2023   Lab Results  Component Value Date   TRIG 255.0 (H) 10/29/2023   Lab Results  Component Value Date   CHOLHDL 6 10/29/2023   Lab Results  Component Value Date   HGBA1C 8.1 (H) 10/29/2023      Assessment & Plan:  Type 2 diabetes mellitus with hyperglycemia, without long-term current use of insulin (HCC) Assessment & Plan: Patient checks blood glucose twice weekly, reports normal readings but unable to provide specific numbers.  -Encourage patient to monitor blood glucose levels more consistently and record specific numbers for review. -Continue Rybelsus .  -Will check HgA1c. -Diabetic foot examination normal.  -Referral placed for diabetic eye examination.  Orders: -     Microalbumin / creatinine urine ratio -     Hemoglobin A1c -     Pravastatin  Sodium; Take 1 tablet (20 mg total) by mouth daily.  Dispense: 90 tablet; Refill: 2 -     Ambulatory referral to Ophthalmology -     Ambulatory referral to Vascular Surgery  Hyperlipidemia, unspecified hyperlipidemia  type Assessment & Plan: Patient currently out of cholesterol medication. Last cholesterol level slightly elevated, particularly triglycerides. -Refill cholesterol medication. -Encourage healthier diet, including more vegetables and fruits, less fried food.   Orders: -     Lipid panel -     CBC with Differential/Platelet -     Comprehensive metabolic panel -     Pravastatin  Sodium; Take 1 tablet (20 mg total) by mouth daily.  Dispense: 90 tablet; Refill: 2  Psoriasis Assessment & Plan: Followed by dermatologist. Patient using clobetasol  0.05% cream -Refill clobetasol  ointment, continue Otelza. -Followed by dermatology.   Spider veins of both lower extremities Assessment & Plan: Patient has visible veins on both legs, no symptoms reported. -Refer to vascular specialist for evaluation.  Orders: -     Ambulatory referral to Vascular Surgery  Other orders -     Clobetasol  Propionate; Apply 1 Application topically 2 (two) times daily.  Dispense: 60 g; Refill: 4    Follow-up: Return in about 6 months (around 04/27/2024) for chronic management.   Mersedes Alber, NP

## 2023-10-30 DIAGNOSIS — I8393 Asymptomatic varicose veins of bilateral lower extremities: Secondary | ICD-10-CM | POA: Insufficient documentation

## 2023-10-30 DIAGNOSIS — E1165 Type 2 diabetes mellitus with hyperglycemia: Secondary | ICD-10-CM | POA: Insufficient documentation

## 2023-10-30 MED ORDER — CLOBETASOL PROPIONATE 0.05 % EX OINT: 1.0000 | TOPICAL_OINTMENT | Freq: Two times a day (BID) | CUTANEOUS | 4 refills | Status: AC

## 2023-10-30 NOTE — Assessment & Plan Note (Signed)
 Patient currently out of cholesterol medication. Last cholesterol level slightly elevated, particularly triglycerides. -Refill cholesterol medication. -Encourage healthier diet, including more vegetables and fruits, less fried food.

## 2023-10-30 NOTE — Assessment & Plan Note (Addendum)
 Patient checks blood glucose twice weekly, reports normal readings but unable to provide specific numbers.  -Encourage patient to monitor blood glucose levels more consistently and record specific numbers for review. -Continue Rybelsus .  -Will check HgA1c. -Diabetic foot examination normal.  -Referral placed for diabetic eye examination.

## 2023-10-30 NOTE — Assessment & Plan Note (Addendum)
 Patient has visible veins on both legs, no symptoms reported. -Refer to vascular specialist for evaluation.

## 2023-10-30 NOTE — Assessment & Plan Note (Signed)
 Followed by dermatologist. Patient using clobetasol 0.05% cream -Refill clobetasol ointment, continue Otelza. -Followed by dermatology.

## 2023-11-02 ENCOUNTER — Other Ambulatory Visit: Payer: Self-pay | Admitting: Nurse Practitioner

## 2023-11-02 ENCOUNTER — Telehealth: Payer: Self-pay

## 2023-11-02 DIAGNOSIS — R809 Proteinuria, unspecified: Secondary | ICD-10-CM

## 2023-11-02 NOTE — Telephone Encounter (Signed)
-----   Message from Chelsea Aurora sent at 11/02/2023  7:16 AM EST ----- Please inform pt: The urine microalbumin in high, which means your kidneys may be leaking protein.  This can be a early sign of kidney stress, often caused by diabetes and high blood pressure.  We will repeat in 1 month. Please schedule appointment. Hg A1c decreased from 8.7-8.1 which is good improvement. Triglyceride increased from 227-255.  Continue to manage diet and exercise. No sign of anemia, electrolytes, liver and kidney normal.

## 2023-11-02 NOTE — Telephone Encounter (Signed)
 Left message to call the office back regarding the lab results below. Okay to give lab results.

## 2023-11-02 NOTE — Progress Notes (Signed)
 Please inform pt: The urine microalbumin in high, which means your kidneys may be leaking protein.  This can be a early sign of kidney stress, often caused by diabetes and high blood pressure.  We will repeat in 1 month. Please schedule appointment. Hg A1c decreased from 8.7-8.1 which is good improvement. Triglyceride increased from 227-255.  Continue to manage diet and exercise. No sign of anemia, electrolytes, liver and kidney normal.

## 2023-11-03 ENCOUNTER — Telehealth: Payer: Self-pay

## 2023-11-03 NOTE — Telephone Encounter (Signed)
-----   Message from [facility] T sent at 11/02/2023 10:39 AM EST -----  ----- Message ----- From: Tona Francis, NP Sent: 11/02/2023   7:16 AM EST To: [facility]ra [facility]  Please inform pt: The urine microalbumin in high, which means your kidneys may be leaking protein.  This can be a early sign of kidney stress, often caused by diabetes and high blood pressure.  We will repeat in 1 month. Please schedule appointment. Hg A1c decreased from 8.7-8.1 which is good improvement. Triglyceride increased from 227-255.  Continue to manage diet and exercise. No sign of anemia, electrolytes, liver and kidney normal.

## 2023-11-03 NOTE — Telephone Encounter (Signed)
 Left message to call the office back regarding lab results. Okay to give lab results.

## 2023-11-03 NOTE — Telephone Encounter (Signed)
 Left message to call the office back regarding the lab results below. Okay to give the lab results.

## 2023-12-03 ENCOUNTER — Other Ambulatory Visit: Payer: Commercial Managed Care - PPO

## 2023-12-03 DIAGNOSIS — R809 Proteinuria, unspecified: Secondary | ICD-10-CM

## 2023-12-03 LAB — MICROALBUMIN / CREATININE URINE RATIO
Creatinine,U: 193.2 mg/dL
Microalb Creat Ratio: 27 mg/g (ref 0.0–30.0)
Microalb, Ur: 5.2 mg/dL — ABNORMAL HIGH (ref 0.0–1.9)

## 2023-12-09 ENCOUNTER — Encounter (INDEPENDENT_AMBULATORY_CARE_PROVIDER_SITE_OTHER): Payer: Self-pay

## 2024-01-06 ENCOUNTER — Telehealth: Payer: Self-pay | Admitting: Nurse Practitioner

## 2024-01-06 ENCOUNTER — Other Ambulatory Visit: Payer: Self-pay

## 2024-01-06 DIAGNOSIS — R809 Proteinuria, unspecified: Secondary | ICD-10-CM

## 2024-01-14 ENCOUNTER — Encounter (INDEPENDENT_AMBULATORY_CARE_PROVIDER_SITE_OTHER): Payer: Self-pay | Admitting: Nurse Practitioner

## 2024-02-28 ENCOUNTER — Other Ambulatory Visit (HOSPITAL_COMMUNITY): Payer: Self-pay

## 2024-02-28 ENCOUNTER — Telehealth: Payer: Self-pay

## 2024-02-28 NOTE — Telephone Encounter (Signed)
 Pharmacy Patient Advocate Encounter   Received notification from Patient Pharmacy that prior authorization for Rybelsus  7 is required/requested.   Insurance verification completed.   The patient is insured through [employer] .   Per test claim: Refill too soon. PA is not needed at this time. Medication was filled 12/31/23. Next eligible fill date is 03/07/24.

## 2024-03-07 ENCOUNTER — Encounter (INDEPENDENT_AMBULATORY_CARE_PROVIDER_SITE_OTHER): Payer: Self-pay

## 2024-03-10 ENCOUNTER — Telehealth: Payer: Self-pay

## 2024-03-10 ENCOUNTER — Other Ambulatory Visit (HOSPITAL_COMMUNITY): Payer: Self-pay

## 2024-03-10 NOTE — Telephone Encounter (Signed)
 Pharmacy Patient Advocate Encounter  Received notification from [facility] that Prior Authorization for Rybelsus  7MG  tablets has been APPROVED from 02/09/24 to 03/10/25   PA #/Case ID/Reference #: [id number]   *PA RENEWAL*

## 2024-03-10 NOTE — Telephone Encounter (Signed)
 PA request has been Started. New Encounter has been or will be created for follow up. For additional info see Pharmacy Prior Auth telephone encounter from 03/10/24.

## 2024-03-10 NOTE — Telephone Encounter (Signed)
 Pharmacy Patient Advocate Encounter   Received notification from Pt Calls Messages that prior authorization for Rybelsus  7MG  tablets is due for renewal.   Insurance verification completed.   The patient is insured through [employer].  Action: PA required; PA started via CoverMyMeds. KEY [id number] . Waiting for clinical questions to populate.

## 2024-03-10 NOTE — Telephone Encounter (Signed)
 Clinical questions have been answered and PA submitted. PA currently Pending.

## 2024-03-13 ENCOUNTER — Other Ambulatory Visit: Payer: Self-pay | Admitting: Nurse Practitioner

## 2024-03-13 DIAGNOSIS — E119 Type 2 diabetes mellitus without complications: Secondary | ICD-10-CM

## 2024-03-21 ENCOUNTER — Encounter: Payer: Self-pay | Admitting: Pediatrics

## 2024-03-21 DIAGNOSIS — R1011 Right upper quadrant pain: Secondary | ICD-10-CM

## 2024-03-22 ENCOUNTER — Other Ambulatory Visit: Payer: Self-pay | Admitting: Pediatrics

## 2024-03-22 DIAGNOSIS — R1013 Epigastric pain: Secondary | ICD-10-CM

## 2024-03-22 DIAGNOSIS — R1011 Right upper quadrant pain: Secondary | ICD-10-CM

## 2024-04-28 ENCOUNTER — Ambulatory Visit: Payer: Commercial Managed Care - PPO | Admitting: Nurse Practitioner

## 2024-06-09 ENCOUNTER — Ambulatory Visit: Admitting: Nurse Practitioner

## 2024-06-23 ENCOUNTER — Ambulatory Visit: Admitting: Nurse Practitioner

## 2024-08-11 ENCOUNTER — Ambulatory Visit: Admitting: Nurse Practitioner

## 2024-08-11 ENCOUNTER — Encounter: Payer: Self-pay | Admitting: Nurse Practitioner

## 2024-08-11 VITALS — BP 114/78 | HR 82 | Temp 98.2°F | Ht 65.0 in | Wt 164.4 lb

## 2024-08-11 DIAGNOSIS — M79645 Pain in left finger(s): Secondary | ICD-10-CM | POA: Diagnosis not present

## 2024-08-11 DIAGNOSIS — Z113 Encounter for screening for infections with a predominantly sexual mode of transmission: Secondary | ICD-10-CM

## 2024-08-11 DIAGNOSIS — E1165 Type 2 diabetes mellitus with hyperglycemia: Secondary | ICD-10-CM

## 2024-08-11 DIAGNOSIS — K625 Hemorrhage of anus and rectum: Secondary | ICD-10-CM | POA: Diagnosis not present

## 2024-08-11 DIAGNOSIS — E785 Hyperlipidemia, unspecified: Secondary | ICD-10-CM

## 2024-08-11 DIAGNOSIS — Z7984 Long term (current) use of oral hypoglycemic drugs: Secondary | ICD-10-CM

## 2024-08-11 DIAGNOSIS — N489 Disorder of penis, unspecified: Secondary | ICD-10-CM

## 2024-08-11 MED ORDER — PRAVASTATIN SODIUM 20 MG PO TABS: 20.0000 mg | ORAL_TABLET | Freq: Every day | ORAL | 2 refills | Status: AC

## 2024-08-11 NOTE — Progress Notes (Signed)
 Established Patient Office Visit  Subjective:  Patient ID: Bill Herrera, male    DOB: 01-Mar-1981  Age: 43 y.o. MRN: [id number]  CC:  Chief Complaint  Patient presents with   Medical Management of Chronic Issues   Discussed the use of a AI scribe software for clinical note transcription with the patient, who gave verbal consent to proceed.  HPI  Bill Herrera is a 43 year old male with diabetes who presents for follow-up of his diabetes management and other health concerns.  His last hemoglobin A1c was 8.1% in January, and he has not had any labs done since. He monitors his blood sugar at home and takes Rybelsus  for diabetes management, obtained through his employer's [employer].  He has psoriasis with current skin lesions. Due to work commitments, he has not attended dermatology appointments. His condition was worse during his last visit but has since improved.  He has observed bright red blood intermittently when wiping after bowel movements, first noticed this year. He is unsure if he has hemorrhoids and describes the bleeding as sometimes spraying. There is no known history of hemorrhoids.   He takes pravastatin  for cholesterol management. Denise leg cramps.   He also reports dryness around the penal area with no sores, pain or itching.   He experiences limited extension, pain, and redness in his left thumb intermittently. There is no history of gout.  HPI   Past Medical History:  Diagnosis Date   Diabetes mellitus without complication (HCC)    Frequent headaches    Lipoma    x4 removed with 12 more as of 09/22/2019    Past Surgical History:  Procedure Laterality Date   right cyst removal     right forehead     Family History  Problem Relation Age of Onset   Asthma Father    Diabetes Father        2   Hypertension Father     Social History   Socioeconomic History   Marital status: Single    Spouse name: Not on file   Number of children: Not on file    Years of education: Not on file   Highest education level: Not on file  Occupational History   Not on file  Tobacco Use   Smoking status: Never   Smokeless tobacco: Never  Substance and Sexual Activity   Alcohol use: Yes    Alcohol/week: 2.0 standard drinks of alcohol    Types: 2 Cans of beer per week   Drug use: Not Currently   Sexual activity: Yes    Partners: Female  Other Topics Concern   Not on file  Social History Narrative   HS ed    HVAC    58 kids    58 days old son Olita as of 10/31/21, emily sophia    Social Drivers of Health   Financial Resource Strain: Low Risk  (12/04/2020)   Overall Financial Resource Strain (CARDIA)    Difficulty of Paying Living Expenses: Not hard at all  Recent Concern: Financial Resource Strain - Medium Risk (09/27/2020)   Overall Financial Resource Strain (CARDIA)    Difficulty of Paying Living Expenses: Somewhat hard  Food Insecurity: Not on file  Transportation Needs: Not on file  Physical Activity: Not on file  Stress: Not on file  Social Connections: Not on file  Intimate Partner Violence: Not on file     Outpatient Medications Prior to Visit  Medication Sig Dispense Refill   clobetasol  ointment (  TEMOVATE ) 0.05 % Apply 1 Application topically 2 (two) times daily. 60 g 4   glucose blood test strip Use as instructed E11.9. Accu chek guid test strips check blood sugar in am before breakfast, after lunch and after dinner 300 each 12   Lancets (ACCU-CHEK SOFT TOUCH) lancets E 11.9 Use as instructed check blood sugar 3x per day 300 each 12   RYBELSUS  7 MG TABS TAKE 1 TABLET DAILY (TITRATED UP FROM 3 MG) 90 tablet 3   triamcinolone  cream (KENALOG ) 0.1 % Apply 1 application topically 2 (two) times daily. Prn 454 g 1   pravastatin  (PRAVACHOL ) 20 MG tablet Take 1 tablet (20 mg total) by mouth daily. 90 tablet 2   No facility-administered medications prior to visit.    No Known Allergies  ROS Review of Systems Negative unless indicated  in HPI.    Objective:    Physical Exam Constitutional:      Appearance: Normal appearance.  HENT:     Mouth/Throat:     Mouth: Mucous membranes are moist.  Eyes:     Conjunctiva/sclera: Conjunctivae normal.     Pupils: Pupils are equal, round, and reactive to light.  Cardiovascular:     Rate and Rhythm: Normal rate and regular rhythm.     Pulses: Normal pulses.     Heart sounds: Normal heart sounds.  Pulmonary:     Effort: Pulmonary effort is normal.     Breath sounds: Normal breath sounds.  Abdominal:     General: Bowel sounds are normal.     Palpations: Abdomen is soft.  Musculoskeletal:     Cervical back: Normal range of motion. No tenderness.  Skin:    General: Skin is warm.     Findings: No bruising.  Neurological:     General: No focal deficit present.     Mental Status: He is alert and oriented to person, place, and time. Mental status is at baseline.  Psychiatric:        Mood and Affect: Mood normal.        Behavior: Behavior normal.        Thought Content: Thought content normal.        Judgment: Judgment normal.     BP 114/78   Pulse 82   Temp 98.2 F (36.8 C)   Ht 5' 5 (1.651 m)   Wt 164 lb 6.4 oz (74.6 kg)   SpO2 97%   BMI 27.36 kg/m  Wt Readings from Last 3 Encounters:  08/11/24 164 lb 6.4 oz (74.6 kg)  10/29/23 169 lb 12 oz (77 kg)  01/01/23 169 lb 6.4 oz (76.8 kg)     Health Maintenance  Topic Date Due   OPHTHALMOLOGY EXAM  Never done   Pneumococcal Vaccine (1 of 2 - PCV) Never done   Hepatitis B Vaccines 19-59 Average Risk (1 of 3 - 19+ 3-dose series) Never done   HPV VACCINES (1 - Risk 3-dose SCDM series) Never done   COVID-19 Vaccine (3 - Moderna risk series) 08/26/2024 (Originally 04/27/2020)   Influenza Vaccine  01/16/2025 (Originally 05/19/2024)   FOOT EXAM  10/28/2024   HEMOGLOBIN A1C  02/09/2025   Diabetic kidney evaluation - eGFR measurement  08/11/2025   Diabetic kidney evaluation - Urine ACR  08/11/2025   DTaP/Tdap/Td (2 - Td  or Tdap) 09/23/2028   Hepatitis C Screening  Completed   HIV Screening  Completed   Meningococcal B Vaccine  Aged Out       Topic Date  Due   Hepatitis B Vaccines 19-59 Average Risk (1 of 3 - 19+ 3-dose series) Never done   HPV VACCINES (1 - Risk 3-dose SCDM series) Never done    Lab Results  Component Value Date   TSH 1.02 01/01/2023   Lab Results  Component Value Date   WBC 5.3 08/11/2024   HGB 15.7 08/11/2024   HCT 46.9 08/11/2024   MCV 90 08/11/2024   PLT 263 08/11/2024   Lab Results  Component Value Date   NA 135 08/11/2024   K 4.1 08/11/2024   CO2 24 08/11/2024   GLUCOSE 291 (H) 08/11/2024   BUN 12 08/11/2024   CREATININE 0.84 08/11/2024   BILITOT 0.5 08/11/2024   ALKPHOS 142 (H) 08/11/2024   AST 17 08/11/2024   ALT 23 08/11/2024   PROT 7.1 08/11/2024   ALBUMIN 4.6 08/11/2024   CALCIUM 10.0 08/11/2024   EGFR 112 08/11/2024   GFR 108.79 10/29/2023   Lab Results  Component Value Date   CHOL 232 (H) 08/11/2024   Lab Results  Component Value Date   HDL 36 (L) 08/11/2024   Lab Results  Component Value Date   LDLCALC 142 (H) 08/11/2024   Lab Results  Component Value Date   TRIG 296 (H) 08/11/2024   Lab Results  Component Value Date   CHOLHDL 6.4 (H) 08/11/2024   Lab Results  Component Value Date   HGBA1C 12.5 (H) 08/11/2024      Assessment & Plan:  Pain of left thumb Assessment & Plan: Intermittent pain and swelling with redness. No redness at present. - Order uric acid level to assess for gout.  Orders: -     Uric acid  Hyperlipidemia, unspecified hyperlipidemia type Assessment & Plan: Continue pravastatin  for cardiovascular risk reduction. -Encourage healthier diet, including more vegetables and fruits, less fried food.   Orders: -     Pravastatin  Sodium; Take 1 tablet (20 mg total) by mouth daily.  Dispense: 90 tablet; Refill: 2 -     Lipid panel -     CBC with Differential/Platelet -     Comprehensive metabolic panel with  GFR  Type 2 diabetes mellitus with hyperglycemia, without long-term current use of insulin (HCC) Assessment & Plan: -Encourage patient to monitor blood glucose levels more consistently and record specific numbers for review. -Continue Rybelsus .  -Will check HgA1c.   Orders: -     Pravastatin  Sodium; Take 1 tablet (20 mg total) by mouth daily.  Dispense: 90 tablet; Refill: 2 -     Hemoglobin A1c -     Microalbumin / creatinine urine ratio  Rectal bleeding Assessment & Plan: Intermittent rectal bleeding. - IFOB pending.  Orders: -     Fecal occult blood, imunochemical; Future  Screening examination for STI -     STI Profile, CT/NG/TV  Lesion of penis Assessment & Plan: Lesion on the penial area. -Will check for STIs.   Orders: -     STI Profile, CT/NG/TV  Other orders -     Interpretation:    Follow-up: Return in about 3 months (around 11/11/2024) for diabetes.   Dianne Bady, NP

## 2024-08-14 LAB — STI PROFILE, CT/NG/TV
HCV Ab: NONREACTIVE
HIV Screen 4th Generation wRfx: NONREACTIVE
Hep B Core Total Ab: NEGATIVE
Hep B Surface Ab, Qual: NONREACTIVE
Hepatitis B Surface Ag: NEGATIVE
RPR Ser Ql: NONREACTIVE

## 2024-08-14 LAB — HCV INTERPRETATION

## 2024-08-15 LAB — COMPREHENSIVE METABOLIC PANEL WITH GFR
ALT: 23 IU/L (ref 0–44)
AST: 17 IU/L (ref 0–40)
Albumin: 4.6 g/dL (ref 4.1–5.1)
Alkaline Phosphatase: 142 IU/L — ABNORMAL HIGH (ref 47–123)
BUN/Creatinine Ratio: 14 (ref 9–20)
BUN: 12 mg/dL (ref 6–24)
Bilirubin Total: 0.5 mg/dL (ref 0.0–1.2)
CO2: 24 mmol/L (ref 20–29)
Calcium: 10 mg/dL (ref 8.7–10.2)
Chloride: 99 mmol/L (ref 96–106)
Creatinine, Ser: 0.84 mg/dL (ref 0.76–1.27)
Globulin, Total: 2.5 g/dL (ref 1.5–4.5)
Glucose: 291 mg/dL — ABNORMAL HIGH (ref 70–99)
Potassium: 4.1 mmol/L (ref 3.5–5.2)
Sodium: 135 mmol/L (ref 134–144)
Total Protein: 7.1 g/dL (ref 6.0–8.5)
eGFR: 112 mL/min/1.73 (ref >59)

## 2024-08-15 LAB — MICROALBUMIN / CREATININE URINE RATIO
Creatinine, Urine: 45.1 mg/dL
Microalb/Creat Ratio: 49 mg/g{creat} — AB (ref 0–29)
Microalbumin, Urine: 22.1 ug/mL

## 2024-08-15 LAB — CBC WITH DIFFERENTIAL/PLATELET
Basophils Absolute: 0 x10E3/uL (ref 0.0–0.2)
Basos: 1 %
EOS (ABSOLUTE): 0.1 x10E3/uL (ref 0.0–0.4)
Eos: 2 %
Hematocrit: 46.9 % (ref 37.5–51.0)
Hemoglobin: 15.7 g/dL (ref 13.0–17.7)
Immature Grans (Abs): 0 x10E3/uL (ref 0.0–0.1)
Immature Granulocytes: 0 %
Lymphocytes Absolute: 1.5 x10E3/uL (ref 0.7–3.1)
Lymphs: 28 %
MCH: 30 pg (ref 26.6–33.0)
MCHC: 33.5 g/dL (ref 31.5–35.7)
MCV: 90 fL (ref 79–97)
Monocytes Absolute: 0.3 x10E3/uL (ref 0.1–0.9)
Monocytes: 5 %
Neutrophils Absolute: 3.4 x10E3/uL (ref 1.4–7.0)
Neutrophils: 64 %
Platelets: 263 x10E3/uL (ref 150–450)
RBC: 5.23 x10E6/uL (ref 4.14–5.80)
RDW: 12.3 % (ref 11.6–15.4)
WBC: 5.3 x10E3/uL (ref 3.4–10.8)

## 2024-08-15 LAB — HEMOGLOBIN A1C
Est. average glucose Bld gHb Est-mCnc: 312 mg/dL
Hgb A1c MFr Bld: 12.5 % — ABNORMAL HIGH (ref 4.8–5.6)

## 2024-08-15 LAB — LIPID PANEL
Chol/HDL Ratio: 6.4 ratio — ABNORMAL HIGH (ref 0.0–5.0)
Cholesterol, Total: 232 mg/dL — ABNORMAL HIGH (ref 100–199)
HDL: 36 mg/dL — ABNORMAL LOW (ref >39)
LDL Chol Calc (NIH): 142 mg/dL — ABNORMAL HIGH (ref 0–99)
Triglycerides: 296 mg/dL — ABNORMAL HIGH (ref 0–149)
VLDL Cholesterol Cal: 54 mg/dL — ABNORMAL HIGH (ref 5–40)

## 2024-08-15 LAB — URIC ACID: Uric Acid: 4.1 mg/dL (ref 3.8–8.4)

## 2024-08-17 ENCOUNTER — Ambulatory Visit: Payer: Self-pay | Admitting: Nurse Practitioner

## 2024-08-18 ENCOUNTER — Encounter: Payer: Self-pay | Admitting: Nurse Practitioner

## 2024-08-18 DIAGNOSIS — N489 Disorder of penis, unspecified: Secondary | ICD-10-CM | POA: Insufficient documentation

## 2024-08-18 DIAGNOSIS — K625 Hemorrhage of anus and rectum: Secondary | ICD-10-CM | POA: Insufficient documentation

## 2024-08-18 DIAGNOSIS — M79645 Pain in left finger(s): Secondary | ICD-10-CM | POA: Insufficient documentation

## 2024-08-18 NOTE — Assessment & Plan Note (Signed)
 Intermittent rectal bleeding. - IFOB pending.

## 2024-08-18 NOTE — Assessment & Plan Note (Signed)
 Continue pravastatin  for cardiovascular risk reduction. -Encourage healthier diet, including more vegetables and fruits, less fried food.

## 2024-08-18 NOTE — Assessment & Plan Note (Signed)
-  Encourage patient to monitor blood glucose levels more consistently and record specific numbers for review. -Continue Rybelsus .  -Will check HgA1c.

## 2024-08-18 NOTE — Assessment & Plan Note (Deleted)
 Lesion on the penial area. -Will check for STI.

## 2024-08-18 NOTE — Assessment & Plan Note (Signed)
 Lesion on the penial area. -Will check for STIs.

## 2024-08-18 NOTE — Assessment & Plan Note (Signed)
 Intermittent pain and swelling with redness. No redness at present. - Order uric acid level to assess for gout.

## 2024-08-25 ENCOUNTER — Ambulatory Visit

## 2024-08-25 NOTE — Progress Notes (Signed)
 I was unable to get the freestyle libre connected due to PT changing his mind. He doesn't want to continue with it

## 2024-09-08 ENCOUNTER — Ambulatory Visit: Admitting: Nurse Practitioner

## 2024-09-08 DIAGNOSIS — E119 Type 2 diabetes mellitus without complications: Secondary | ICD-10-CM

## 2024-09-22 NOTE — Telephone Encounter (Signed)
Labs completeed

## 2024-09-29 ENCOUNTER — Ambulatory Visit: Admitting: Nurse Practitioner

## 2024-11-10 ENCOUNTER — Ambulatory Visit: Admitting: Nurse Practitioner

## 2024-11-21 ENCOUNTER — Ambulatory Visit

## 2024-11-21 DIAGNOSIS — K625 Hemorrhage of anus and rectum: Secondary | ICD-10-CM | POA: Diagnosis not present

## 2024-11-21 LAB — FECAL OCCULT BLOOD, IMMUNOCHEMICAL: Fecal Occult Bld: NEGATIVE

## 2024-11-24 ENCOUNTER — Ambulatory Visit: Admitting: Nurse Practitioner

## 2024-12-08 ENCOUNTER — Ambulatory Visit: Admitting: Nurse Practitioner

## 2024-12-29 ENCOUNTER — Ambulatory Visit: Admitting: Nurse Practitioner
